# Patient Record
Sex: Female | Born: 1995 | Race: Black or African American | Hispanic: No | Marital: Single | Smoking: Never smoker
Health system: Southern US, Community
[De-identification: ages and names within clinical notes are randomized; demographics above are authoritative.]

## PROBLEM LIST (undated history)

## (undated) DIAGNOSIS — O24419 Gestational diabetes mellitus in pregnancy, unspecified control: Secondary | ICD-10-CM

## (undated) DIAGNOSIS — Z789 Other specified health status: Secondary | ICD-10-CM

## (undated) HISTORY — DX: Gestational diabetes mellitus in pregnancy, unspecified control: O24.419

## (undated) HISTORY — PX: EYE SURGERY: SHX253

---

## 2015-05-26 LAB — OB RESULTS CONSOLE ANTIBODY SCREEN: ANTIBODY SCREEN: NEGATIVE

## 2015-05-26 LAB — OB RESULTS CONSOLE RUBELLA ANTIBODY, IGM: RUBELLA: IMMUNE

## 2015-05-26 LAB — OB RESULTS CONSOLE RPR: RPR: NONREACTIVE

## 2015-05-26 LAB — OB RESULTS CONSOLE HEPATITIS B SURFACE ANTIGEN: HEP B S AG: NEGATIVE

## 2015-05-26 LAB — OB RESULTS CONSOLE ABO/RH: RH TYPE: POSITIVE

## 2015-05-26 LAB — OB RESULTS CONSOLE HIV ANTIBODY (ROUTINE TESTING): HIV: NONREACTIVE

## 2015-05-31 NOTE — L&D Delivery Note (Signed)
Delivery Note  First Stage: Labor onset: 1932 Augmentation : pitocin Analgesia /Anesthesia intrapartum: nitrous oxide AROM at 1932  Second Stage: Complete dilation at 2229 Onset of pushing at 2229 FHR second stage 115 with pushing, but back to 135 in between  Delivery of a viable female at 2237 by CNM in ROA position Loose nuchal cord x 1 Cord double clamped after cessation of pulsation, cut by FOB Cord blood sample collected    Third Stage: Placenta delivered via Tomasa BlaseSchultz intact with 3 VC @ 2248 Placenta disposition: L&D Uterine tone firm / bleeding minimal  2nd degree vaginal laceration identified  Anesthesia for repair: nitrous oxide and 1% Lidocaine Repair 3-0 vicryl Est. Blood Loss (mL): 150  Complications: none  Mom to postpartum.  Baby to Couplet care / Skin to Skin.  Newborn: Birth Weight: 8 lbs 5 oz  Apgar Scores: 8/9 Feeding planned: breast  Raelyn MoraAWSON, Rhonda Cisneros, M  MSN, CNM 12/09/2015, 11:16 PM

## 2015-07-17 LAB — OB RESULTS CONSOLE GC/CHLAMYDIA
Chlamydia: NEGATIVE
Gonorrhea: NEGATIVE

## 2015-09-20 ENCOUNTER — Inpatient Hospital Stay (HOSPITAL_COMMUNITY)
Admission: AD | Admit: 2015-09-20 | Discharge: 2015-09-20 | Disposition: A | Discharge status: Home / Self Care | Payer: BLUE CROSS/BLUE SHIELD | Source: Ambulatory Visit | Attending: Obstetrics & Gynecology | Admitting: Obstetrics & Gynecology

## 2015-09-20 ENCOUNTER — Encounter (HOSPITAL_COMMUNITY): Payer: Self-pay | Admitting: *Deleted

## 2015-09-20 DIAGNOSIS — N898 Other specified noninflammatory disorders of vagina: Secondary | ICD-10-CM | POA: Diagnosis not present

## 2015-09-20 DIAGNOSIS — O26892 Other specified pregnancy related conditions, second trimester: Secondary | ICD-10-CM | POA: Insufficient documentation

## 2015-09-20 DIAGNOSIS — O26893 Other specified pregnancy related conditions, third trimester: Secondary | ICD-10-CM

## 2015-09-20 DIAGNOSIS — Z3A27 27 weeks gestation of pregnancy: Secondary | ICD-10-CM | POA: Insufficient documentation

## 2015-09-20 DIAGNOSIS — R109 Unspecified abdominal pain: Secondary | ICD-10-CM | POA: Diagnosis present

## 2015-09-20 HISTORY — DX: Other specified health status: Z78.9

## 2015-09-20 LAB — URINALYSIS, ROUTINE W REFLEX MICROSCOPIC
BILIRUBIN URINE: NEGATIVE
GLUCOSE, UA: NEGATIVE mg/dL
HGB URINE DIPSTICK: NEGATIVE
Ketones, ur: NEGATIVE mg/dL
Leukocytes, UA: NEGATIVE
Nitrite: NEGATIVE
Protein, ur: NEGATIVE mg/dL
SPECIFIC GRAVITY, URINE: 1.015 (ref 1.005–1.030)
pH: 6.5 (ref 5.0–8.0)

## 2015-09-20 NOTE — MAU Provider Note (Signed)
History     CSN: [id number]  Arrival date and time: 09/20/15 1303   First Provider Initiated Contact with Patient 09/20/15 1342      Chief Complaint  Patient presents with  . Abdominal Pain   HPI Ms. Rhonda Cisneros is a 20 y.o. G1P0 at 9658w6d who presents to MAU today with complaint of mucous discharge today. The patient denies vaginal bleeding, LOF, contractions, UTI symptoms, fever or N/V/D today. She states occasional mild cramping that has improved today. She has not taken anything for pain. She denies pain now. She does endorse mild, intermittent constipation. She denies complications with the pregnancy and reports good fetal movement today.    OB History    Gravida Para Term Preterm AB TAB SAB Ectopic Multiple Living   1               Past Medical History  Diagnosis Date  . Medical history non-contributory     Past Surgical History  Procedure Laterality Date  . Eye surgery      Family History  Problem Relation Age of Onset  . Alcohol abuse Neg Hx   . Arthritis Neg Hx   . Asthma Neg Hx   . Birth defects Neg Hx   . Cancer Neg Hx   . COPD Neg Hx   . Depression Neg Hx   . Diabetes Neg Hx   . Drug abuse Neg Hx   . Early death Neg Hx   . Hearing loss Neg Hx   . Heart disease Neg Hx   . Hyperlipidemia Neg Hx   . Hypertension Neg Hx   . Kidney disease Neg Hx   . Learning disabilities Neg Hx   . Mental illness Neg Hx   . Mental retardation Neg Hx   . Miscarriages / Stillbirths Neg Hx   . Stroke Neg Hx   . Vision loss Neg Hx   . Varicose Veins Neg Hx     Social History  Substance Use Topics  . Smoking status: Never Smoker   . Smokeless tobacco: None  . Alcohol Use: No    Allergies: No Known Allergies  Prescriptions prior to admission  Medication Sig Dispense Refill Last Dose  . Prenatal Vit-Fe Fumarate-FA (PRENATAL MULTIVITAMIN) TABS tablet Take 1 tablet by mouth daily at 12 noon.   09/19/2015 at Unknown time    Review of Systems  Constitutional:  Negative for fever and malaise/fatigue.  Gastrointestinal: Positive for constipation. Negative for nausea, vomiting, abdominal pain and diarrhea.  Genitourinary: Negative for dysuria, urgency and frequency.       + mucus discharge Neg - vaginal bleeding, LOF   Physical Exam   Blood pressure 129/66, pulse 104, temperature 98.9 F (37.2 C), temperature source Oral, resp. rate 18, last menstrual period 03/09/2015.  Physical Exam  Nursing note and vitals reviewed. Constitutional: She is oriented to person, place, and time. She appears well-developed and well-nourished. No distress.  HENT:  Head: Normocephalic and atraumatic.  Cardiovascular: Normal rate.   Respiratory: Effort normal.  GI: Soft. She exhibits no distension and no mass. There is no tenderness. There is no rebound and no guarding.  Genitourinary: Uterus is enlarged (appropriate for GA). Cervix exhibits no motion tenderness, no discharge and no friability. No bleeding in the vagina. Vaginal discharge (small amount of clear-white mucous discharge) found.  Neurological: She is alert and oriented to person, place, and time.  Skin: Skin is warm and dry. No erythema.  Psychiatric: She has a normal mood  and affect.  Dilation: Closed Effacement (%): Thick Station: Ballotable Exam by:: Harlon Flor PA   Results for orders placed or performed during the hospital encounter of 09/20/15 (from the past 24 hour(s))  Urinalysis, Routine w reflex microscopic (not at [facility])     Status: None   Collection Time: 09/20/15  1:21 PM  Result Value Ref Range   Color, Urine YELLOW YELLOW   APPearance CLEAR CLEAR   Specific Gravity, Urine 1.015 1.005 - 1.030   pH 6.5 5.0 - 8.0   Glucose, UA NEGATIVE NEGATIVE mg/dL   Hgb urine dipstick NEGATIVE NEGATIVE   Bilirubin Urine NEGATIVE NEGATIVE   Ketones, ur NEGATIVE NEGATIVE mg/dL   Protein, ur NEGATIVE NEGATIVE mg/dL   Nitrite NEGATIVE NEGATIVE   Leukocytes, UA NEGATIVE NEGATIVE   Fetal  Monitoring: Baseline: 140 bpm Variability: moderate Accelerations: 15 x15 Decelerations: none Contractions: none  MAU Course  Procedures None  MDM UA today without evidence of dehydration or infection Discussed patient with Dr. Seymour Bars. Agrees with plan for discharge today with plan to follow-up in the office as scheduled.   Assessment and Plan  A: SIUP at [redacted]w[redacted]d Vaginal discharge in pregnancy  P: Discharge home Preterm labor precautions discussed Patient advised to follow-up with [facility] as scheduled for routine prenatal care Patient may return to MAU as needed or if her condition were to change or worsen   Marny Lowenstein, PA-C  09/20/2015, 1:53 PM

## 2015-09-20 NOTE — MAU Note (Signed)
States that she lost her mucous plug this AM around 0900; c/o intermittent mild cramping since yesterday @ 1300; no bleeding or leaking; usually works around 6 hours a day and is on her feet a lot;

## 2015-11-02 ENCOUNTER — Other Ambulatory Visit (HOSPITAL_COMMUNITY): Payer: Self-pay

## 2015-11-05 ENCOUNTER — Other Ambulatory Visit (HOSPITAL_COMMUNITY): Payer: Self-pay | Admitting: Obstetrics and Gynecology

## 2015-11-05 ENCOUNTER — Ambulatory Visit (HOSPITAL_COMMUNITY)
Admission: RE | Admit: 2015-11-05 | Discharge: 2015-11-05 | Disposition: A | Discharge status: Home / Self Care | Payer: BLUE CROSS/BLUE SHIELD | Source: Ambulatory Visit | Attending: Obstetrics and Gynecology | Admitting: Obstetrics and Gynecology

## 2015-11-05 ENCOUNTER — Encounter: Payer: BLUE CROSS/BLUE SHIELD | Attending: Obstetrics & Gynecology | Admitting: *Deleted

## 2015-11-05 DIAGNOSIS — Z3689 Encounter for other specified antenatal screening: Secondary | ICD-10-CM

## 2015-11-05 DIAGNOSIS — O24410 Gestational diabetes mellitus in pregnancy, diet controlled: Secondary | ICD-10-CM

## 2015-11-05 DIAGNOSIS — Z3A27 27 weeks gestation of pregnancy: Secondary | ICD-10-CM

## 2015-11-05 DIAGNOSIS — Z029 Encounter for administrative examinations, unspecified: Secondary | ICD-10-CM | POA: Diagnosis not present

## 2015-11-05 DIAGNOSIS — O283 Abnormal ultrasonic finding on antenatal screening of mother: Secondary | ICD-10-CM

## 2015-11-05 NOTE — Progress Notes (Signed)
  Patient was seen on 11/05/15 for Gestational Diabetes self-management visit. The following learning objectives were met by the patient during this course:   States the definition of Gestational Diabetes  States why dietary management is important in controlling blood glucose  Describes the effects each nutrient has on blood glucose levels  Demonstrates ability to create a balanced meal plan  Demonstrates carbohydrate counting   States when to check blood glucose levels  Demonstrates proper blood glucose monitoring techniques  States the effect of stress and exercise on blood glucose levels  States the importance of limiting caffeine and abstaining from alcohol and smoking  Patient already has a blood glucose monitor and states she is testing 4 times a day as directed by MD. She states her FBG are 92-105 and 2 hour post meal BGs are typically under 120 mg/dl  Patient instructed to monitor glucose levels: FBS: 60 - <90 2 hour: <120  *Patient received handouts:  Nutrition Diabetes and Pregnancy  Carbohydrate Counting List  Patient will be seen for follow-up as needed.

## 2015-11-11 ENCOUNTER — Other Ambulatory Visit (HOSPITAL_COMMUNITY): Payer: Self-pay

## 2015-11-13 LAB — OB RESULTS CONSOLE GBS: GBS: POSITIVE

## 2015-11-23 ENCOUNTER — Inpatient Hospital Stay (HOSPITAL_COMMUNITY)
Admission: AD | Admit: 2015-11-23 | Discharge: 2015-11-23 | Disposition: A | Discharge status: Home / Self Care | Payer: BLUE CROSS/BLUE SHIELD | Source: Ambulatory Visit | Attending: Obstetrics & Gynecology | Admitting: Obstetrics & Gynecology

## 2015-11-23 ENCOUNTER — Encounter (HOSPITAL_COMMUNITY): Payer: Self-pay | Admitting: *Deleted

## 2015-11-23 DIAGNOSIS — Z3493 Encounter for supervision of normal pregnancy, unspecified, third trimester: Secondary | ICD-10-CM | POA: Insufficient documentation

## 2015-11-23 NOTE — MAU Note (Signed)
Pt sent from MD office, had non-reactive NST, needs further monitoring.  Also had U/S in office.  Has intermittent contractions, denies LOF or bleeding.

## 2015-11-23 NOTE — Discharge Instructions (Signed)
4 Count Kick    In vertical position, bring one hip and knee to 90 position, straighten leg forward, keeping toes pointed up. Return to 90 then straighten leg downward. Perform ___ times alternating legs.  Copyright  VHI. All rights reserved.  Fetal Movement Counts Patient Name: __________________________________________________ Patient Due Date: ____________________ Performing a fetal movement count is highly recommended in high-risk pregnancies, but it is good for every pregnant woman to do. Your health care provider may ask you to start counting fetal movements at 28 weeks of the pregnancy. Fetal movements often increase:  After eating a full meal.  After physical activity.  After eating or drinking something sweet or cold.  At rest. Pay attention to when you feel the baby is most active. This will help you notice a pattern of your baby's sleep and wake cycles and what factors contribute to an increase in fetal movement. It is important to perform a fetal movement count at the same time each day when your baby is normally most active.  HOW TO COUNT FETAL MOVEMENTS 1. Find a quiet and comfortable area to sit or lie down on your left side. Lying on your left side provides the best blood and oxygen circulation to your baby. 2. Write down the day and time on a sheet of paper or in a journal. 3. Start counting kicks, flutters, swishes, rolls, or jabs in a 2-hour period. You should feel at least 10 movements within 2 hours. 4. If you do not feel 10 movements in 2 hours, wait 2-3 hours and count again. Look for a change in the pattern or not enough counts in 2 hours. SEEK MEDICAL CARE IF:  You feel less than 10 counts in 2 hours, tried twice.  There is no movement in over an hour.  The pattern is changing or taking longer each day to reach 10 counts in 2 hours.  You feel the baby is not moving as he or she usually does. Date: ____________ Movements: ____________ Start time:  ____________ Doreatha MartinFinish time: ____________  Date: ____________ Movements: ____________ Start time: ____________ Doreatha MartinFinish time: ____________ Date: ____________ Movements: ____________ Start time: ____________ Doreatha MartinFinish time: ____________ Date: ____________ Movements: ____________ Start time: ____________ Doreatha MartinFinish time: ____________ Date: ____________ Movements: ____________ Start time: ____________ Doreatha MartinFinish time: ____________ Date: ____________ Movements: ____________ Start time: ____________ Doreatha MartinFinish time: ____________ Date: ____________ Movements: ____________ Start time: ____________ Doreatha MartinFinish time: ____________ Date: ____________ Movements: ____________ Start time: ____________ Doreatha MartinFinish time: ____________  Date: ____________ Movements: ____________ Start time: ____________ Doreatha MartinFinish time: ____________ Date: ____________ Movements: ____________ Start time: ____________ Doreatha MartinFinish time: ____________ Date: ____________ Movements: ____________ Start time: ____________ Doreatha MartinFinish time: ____________ Date: ____________ Movements: ____________ Start time: ____________ Doreatha MartinFinish time: ____________ Date: ____________ Movements: ____________ Start time: ____________ Doreatha MartinFinish time: ____________ Date: ____________ Movements: ____________ Start time: ____________ Doreatha MartinFinish time: ____________ Date: ____________ Movements: ____________ Start time: ____________ Doreatha MartinFinish time: ____________  Date: ____________ Movements: ____________ Start time: ____________ Doreatha MartinFinish time: ____________ Date: ____________ Movements: ____________ Start time: ____________ Doreatha MartinFinish time: ____________ Date: ____________ Movements: ____________ Start time: ____________ Doreatha MartinFinish time: ____________ Date: ____________ Movements: ____________ Start time: ____________ Doreatha MartinFinish time: ____________ Date: ____________ Movements: ____________ Start time: ____________ Doreatha MartinFinish time: ____________ Date: ____________ Movements: ____________ Start time: ____________ Doreatha MartinFinish time: ____________ Date:  ____________ Movements: ____________ Start time: ____________ Doreatha MartinFinish time: ____________  Date: ____________ Movements: ____________ Start time: ____________ Doreatha MartinFinish time: ____________ Date: ____________ Movements: ____________ Start time: ____________ Doreatha MartinFinish time: ____________ Date: ____________ Movements: ____________ Start time: ____________ Doreatha MartinFinish time: ____________ Date: ____________ Movements: ____________ Start  time: ____________ Doreatha MartinFinish time: ____________ Date: ____________ Movements: ____________ Start time: ____________ Doreatha MartinFinish time: ____________ Date: ____________ Movements: ____________ Start time: ____________ Doreatha MartinFinish time: ____________ Date: ____________ Movements: ____________ Start time: ____________ Doreatha MartinFinish time: ____________  Date: ____________ Movements: ____________ Start time: ____________ Doreatha MartinFinish time: ____________ Date: ____________ Movements: ____________ Start time: ____________ Doreatha MartinFinish time: ____________ Date: ____________ Movements: ____________ Start time: ____________ Doreatha MartinFinish time: ____________ Date: ____________ Movements: ____________ Start time: ____________ Doreatha MartinFinish time: ____________ Date: ____________ Movements: ____________ Start time: ____________ Doreatha MartinFinish time: ____________ Date: ____________ Movements: ____________ Start time: ____________ Doreatha MartinFinish time: ____________ Date: ____________ Movements: ____________ Start time: ____________ Doreatha MartinFinish time: ____________  Date: ____________ Movements: ____________ Start time: ____________ Doreatha MartinFinish time: ____________ Date: ____________ Movements: ____________ Start time: ____________ Doreatha MartinFinish time: ____________ Date: ____________ Movements: ____________ Start time: ____________ Doreatha MartinFinish time: ____________ Date: ____________ Movements: ____________ Start time: ____________ Doreatha MartinFinish time: ____________ Date: ____________ Movements: ____________ Start time: ____________ Doreatha MartinFinish time: ____________ Date: ____________ Movements: ____________ Start  time: ____________ Doreatha MartinFinish time: ____________ Date: ____________ Movements: ____________ Start time: ____________ Doreatha MartinFinish time: ____________  Date: ____________ Movements: ____________ Start time: ____________ Doreatha MartinFinish time: ____________ Date: ____________ Movements: ____________ Start time: ____________ Doreatha MartinFinish time: ____________ Date: ____________ Movements: ____________ Start time: ____________ Doreatha MartinFinish time: ____________ Date: ____________ Movements: ____________ Start time: ____________ Doreatha MartinFinish time: ____________ Date: ____________ Movements: ____________ Start time: ____________ Doreatha MartinFinish time: ____________ Date: ____________ Movements: ____________ Start time: ____________ Doreatha MartinFinish time: ____________ Date: ____________ Movements: ____________ Start time: ____________ Doreatha MartinFinish time: ____________  Date: ____________ Movements: ____________ Start time: ____________ Doreatha MartinFinish time: ____________ Date: ____________ Movements: ____________ Start time: ____________ Doreatha MartinFinish time: ____________ Date: ____________ Movements: ____________ Start time: ____________ Doreatha MartinFinish time: ____________ Date: ____________ Movements: ____________ Start time: ____________ Doreatha MartinFinish time: ____________ Date: ____________ Movements: ____________ Start time: ____________ Doreatha MartinFinish time: ____________ Date: ____________ Movements: ____________ Start time: ____________ Doreatha MartinFinish time: ____________   This information is not intended to replace advice given to you by your health care provider. Make sure you discuss any questions you have with your health care provider.   Document Released: 06/15/2006 Document Revised: 06/06/2014 Document Reviewed: 03/12/2012 Elsevier Interactive Patient Education [employer]2016 Elsevier Inc.

## 2015-12-04 ENCOUNTER — Telehealth (HOSPITAL_COMMUNITY): Payer: Self-pay | Admitting: *Deleted

## 2015-12-04 ENCOUNTER — Encounter (HOSPITAL_COMMUNITY): Payer: Self-pay | Admitting: *Deleted

## 2015-12-04 NOTE — Telephone Encounter (Signed)
Preadmission screen  

## 2015-12-07 ENCOUNTER — Encounter (HOSPITAL_COMMUNITY): Payer: Self-pay | Admitting: *Deleted

## 2015-12-07 ENCOUNTER — Other Ambulatory Visit: Payer: Self-pay | Admitting: Obstetrics and Gynecology

## 2015-12-09 ENCOUNTER — Inpatient Hospital Stay (HOSPITAL_COMMUNITY): Payer: BLUE CROSS/BLUE SHIELD

## 2015-12-09 ENCOUNTER — Inpatient Hospital Stay (HOSPITAL_COMMUNITY)
Admission: RE | Admit: 2015-12-09 | Discharge: 2015-12-11 | DRG: 775 | Disposition: A | Discharge status: Home / Self Care | Payer: BLUE CROSS/BLUE SHIELD | Source: Ambulatory Visit | Attending: Obstetrics and Gynecology | Admitting: Obstetrics and Gynecology

## 2015-12-09 ENCOUNTER — Encounter (HOSPITAL_COMMUNITY): Payer: Self-pay

## 2015-12-09 VITALS — BP 119/68 | HR 68 | Temp 98.6°F | Resp 17 | Ht 66.0 in | Wt 268.0 lb

## 2015-12-09 DIAGNOSIS — Z3A39 39 weeks gestation of pregnancy: Secondary | ICD-10-CM | POA: Diagnosis not present

## 2015-12-09 DIAGNOSIS — O99824 Streptococcus B carrier state complicating childbirth: Secondary | ICD-10-CM | POA: Diagnosis present

## 2015-12-09 DIAGNOSIS — O99214 Obesity complicating childbirth: Secondary | ICD-10-CM | POA: Diagnosis present

## 2015-12-09 DIAGNOSIS — O24419 Gestational diabetes mellitus in pregnancy, unspecified control: Secondary | ICD-10-CM

## 2015-12-09 DIAGNOSIS — O24425 Gestational diabetes mellitus in childbirth, controlled by oral hypoglycemic drugs: Principal | ICD-10-CM | POA: Diagnosis present

## 2015-12-09 DIAGNOSIS — Z6841 Body Mass Index (BMI) 40.0 and over, adult: Secondary | ICD-10-CM

## 2015-12-09 DIAGNOSIS — Z3490 Encounter for supervision of normal pregnancy, unspecified, unspecified trimester: Secondary | ICD-10-CM

## 2015-12-09 HISTORY — DX: Gestational diabetes mellitus in pregnancy, unspecified control: O24.419

## 2015-12-09 LAB — CBC
HEMATOCRIT: 35.4 % — AB (ref 36.0–46.0)
Hemoglobin: 11.9 g/dL — ABNORMAL LOW (ref 12.0–15.0)
MCH: 28.5 pg (ref 26.0–34.0)
MCHC: 33.6 g/dL (ref 30.0–36.0)
MCV: 84.7 fL (ref 78.0–100.0)
PLATELETS: 229 10*3/uL (ref 150–400)
RBC: 4.18 MIL/uL (ref 3.87–5.11)
RDW: 14.2 % (ref 11.5–15.5)
WBC: 10.7 10*3/uL — AB (ref 4.0–10.5)

## 2015-12-09 LAB — GLUCOSE, CAPILLARY
GLUCOSE-CAPILLARY: 101 mg/dL — AB (ref 65–99)
GLUCOSE-CAPILLARY: 86 mg/dL (ref 65–99)
GLUCOSE-CAPILLARY: 70 mg/dL (ref 65–99)
GLUCOSE-CAPILLARY: 94 mg/dL (ref 65–99)

## 2015-12-09 LAB — TYPE AND SCREEN
ABO/RH(D): A POS
ANTIBODY SCREEN: NEGATIVE

## 2015-12-09 LAB — RPR: RPR: NONREACTIVE

## 2015-12-09 LAB — ABO/RH: ABO/RH(D): A POS

## 2015-12-09 MED ORDER — MISOPROSTOL 200 MCG PO TABS
50.0000 ug | ORAL_TABLET | ORAL | Status: DC
  Administered 2015-12-09: 50 ug via ORAL
  Filled 2015-12-09 (×2): qty 0.5

## 2015-12-09 MED ORDER — OXYCODONE-ACETAMINOPHEN 5-325 MG PO TABS: 1.0000 | ORAL_TABLET | ORAL | Status: DC | PRN

## 2015-12-09 MED ORDER — ACETAMINOPHEN 325 MG PO TABS: 650.0000 mg | ORAL_TABLET | ORAL | Status: DC | PRN

## 2015-12-09 MED ORDER — OXYTOCIN BOLUS FROM INFUSION
500.0000 mL | INTRAVENOUS | Status: DC
  Administered 2015-12-09: 500 mL via INTRAVENOUS

## 2015-12-09 MED ORDER — TERBUTALINE SULFATE 1 MG/ML IJ SOLN
0.2500 mg | Freq: Once | INTRAMUSCULAR | Status: DC | PRN
  Filled 2015-12-09: qty 1

## 2015-12-09 MED ORDER — LACTATED RINGERS IV SOLN: 500.0000 mL | INTRAVENOUS | Status: DC | PRN

## 2015-12-09 MED ORDER — ONDANSETRON HCL 4 MG/2ML IJ SOLN
4.0000 mg | Freq: Four times a day (QID) | INTRAMUSCULAR | Status: DC | PRN
  Administered 2015-12-09: 4 mg via INTRAVENOUS
  Filled 2015-12-09: qty 2

## 2015-12-09 MED ORDER — LIDOCAINE HCL (PF) 1 % IJ SOLN
30.0000 mL | INTRAMUSCULAR | Status: DC | PRN
  Administered 2015-12-09: 30 mL via SUBCUTANEOUS
  Filled 2015-12-09: qty 30

## 2015-12-09 MED ORDER — SOD CITRATE-CITRIC ACID 500-334 MG/5ML PO SOLN: 30.0000 mL | ORAL | Status: DC | PRN

## 2015-12-09 MED ORDER — PENICILLIN G POTASSIUM 5000000 UNITS IJ SOLR
5.0000 10*6.[IU] | Freq: Once | INTRAVENOUS | Status: AC
  Administered 2015-12-09: 5 10*6.[IU] via INTRAVENOUS
  Filled 2015-12-09: qty 5

## 2015-12-09 MED ORDER — OXYCODONE-ACETAMINOPHEN 5-325 MG PO TABS: 2.0000 | ORAL_TABLET | ORAL | Status: DC | PRN

## 2015-12-09 MED ORDER — OXYTOCIN 40 UNITS IN LACTATED RINGERS INFUSION - SIMPLE MED: 2.5000 [IU]/h | INTRAVENOUS | Status: DC

## 2015-12-09 MED ORDER — LACTATED RINGERS IV SOLN
INTRAVENOUS | Status: DC
  Administered 2015-12-09 (×2): via INTRAVENOUS

## 2015-12-09 MED ORDER — METFORMIN HCL ER 500 MG PO TB24
1000.0000 mg | ORAL_TABLET | Freq: Every day | ORAL | Status: DC
  Filled 2015-12-09: qty 2

## 2015-12-09 MED ORDER — OXYTOCIN 40 UNITS IN LACTATED RINGERS INFUSION - SIMPLE MED
1.0000 m[IU]/min | INTRAVENOUS | Status: DC
  Administered 2015-12-09: 2 m[IU]/min via INTRAVENOUS
  Filled 2015-12-09: qty 1000

## 2015-12-09 MED ORDER — NALBUPHINE HCL 10 MG/ML IJ SOLN: 5.0000 mg | INTRAMUSCULAR | Status: DC | PRN

## 2015-12-09 MED ORDER — PENICILLIN G POTASSIUM 5000000 UNITS IJ SOLR
2.5000 10*6.[IU] | INTRAVENOUS | Status: DC
  Administered 2015-12-09 (×2): 2.5 10*6.[IU] via INTRAVENOUS
  Filled 2015-12-09 (×7): qty 2.5

## 2015-12-09 NOTE — Progress Notes (Signed)
Inpatient Diabetes Program Recommendations  AACE/ADA: New Consensus Statement on Inpatient Glycemic Control (2015)  Target Ranges:  Prepandial:   less than 140 mg/dL      Peak postprandial:   less than 180 mg/dL (1-2 hours)      Critically ill patients:  140 - 180 mg/dL   No results found for: GLUCAP, HGBA1C  Review of Glycemic Control  Diabetes Treatment Program Recommendations  ADA Standards of Care 2017 Diabetes in Pregnancy Target Glucose Ranges:  Fasting: 60 - 90 mg/dL Preprandial: 60 - 811105 mg/dL 1 hr postprandial: Less than 140mg /dL (from first bite of meal) 2 hr postprandial: Less than 120 mg/dL (from first bit of meal)  Inpatient Diabetes Program Recommendations:    Although patient is gestational dm only on metformin, please consider checking a cbg at this time.  Thank you [name], RN, MSN, CDE  Diabetes Inpatient Program Office: [phone] Pager: [phone] 8:00 am to 5:00 pm

## 2015-12-09 NOTE — Progress Notes (Addendum)
Patient ID: Rhonda Cisneros, female   DOB: 05-16-1996, 20 y.o.   MRN: [id number] Subjective: Rhonda Cisneros is a 20 y.o. G1P0 at 1318w2d by ultrasound admitted for induction of labor due to Gestational diabetes.  Objective: Filed Vitals:   12/09/15 1342 12/09/15 1500 12/09/15 1501 12/09/15 1529  BP: 102/83  128/75 120/72  Pulse: 86  76 88  Temp:      TempSrc:      Resp:  16  16  Height:      Weight:          FHT:  FHR: 145 bpm, variability: moderate,  accelerations:  Present,  decelerations:  Absent UC:   irregular SVE:   Dilation: 5 Effacement (%): 40 Station: -3 Exam by:: k fields, rn Foley balloon out @ 1340  Labs:   Recent Labs  12/09/15 0845  WBC 10.7*  HGB 11.9*  HCT 35.4*  PLT 229  CBG values: 101 & 86  Assessment / Plan: Induction of labor due to gestational diabetes,  progressing well with foley balloon and cytotec GDMA2   Labor: Progressing normally Preeclampsia:  N/A Fetal Wellbeing:  Category I Pain Control:  Labor support without medications  Continue CBG checks every 4 hours Start Pitocin at 2 mU and increase by 2mU Plan AROM in active labor and better cervical progression Anticipated MOD:  Guarded for NSVD  *Dr. Billy Coastaavon notified of pt's progress and POC - agrees  Rhonda Cisneros, Rhonda Cisneros, M, MSN, CNM 12/09/2015, 1:55 PM

## 2015-12-09 NOTE — Progress Notes (Signed)
Patient ID: Rhonda Cisneros, female   DOB: Apr 08, 1996, 20 y.o.   MRN: [id number] S: Doing well, reports not feeling any pain; rating pain 2/10.   O: Filed Vitals:   12/09/15 1736 12/09/15 1801 12/09/15 1803 12/09/15 1833  BP: 126/67  92/48 111/51  Pulse: 83  80 68  Temp:      TempSrc:      Resp:  16  16  Height:      Weight:         FHT:  FHR: 140 bpm, variability: moderate,  accelerations:  Present,  decelerations:  Present variables with occ. late decels UC:   irregular, every 1-8 minutes SVE:  Deferred Pitocin decreased to 8 mU with late decels / pitocin now increased to 10 mU with resolution of late decels   A / P: Induction of labor due to gestational diabetes,  progressing well on pitocin  Fetal Wellbeing:  Category I Pain Control:  Labor support without medications  Anticipated MOD:  guarded for NSVD  Rhonda Cisneros, Tayt Moyers, Judie PetitM MSN, CNM 12/09/2015, 6:34 PM

## 2015-12-09 NOTE — H&P (Signed)
OB ADMISSION/ HISTORY & PHYSICAL:  Admission Date: 12/09/2015  7:28 AM  Admit Diagnosis: Induction of Labor / GDM Class A2  Rhonda Cisneros is a 20 y.o. female presenting for IOL for GDM A2.  Prenatal History: G1P0   EDC : 12/14/2015, by Last Menstrual Period  Prenatal care at [facility] since 11.[redacted] weeks gestation [facility]: Bauer Ausborn,CNM  Prenatal course complicated by Obesity / GDM A2 / Excessive maternal weight gain /   Prenatal Labs: ABO, Rh: A (12/27 0000)  Antibody: Negative (12/27 0000) Rubella: Immune (12/27 0000)  RPR: Nonreactive (12/27 0000)  HBsAg: Negative (12/27 0000)  HIV: Non-reactive (12/27 0000)  GBS: Positive (06/16 0000)  GTT : Abnormal 1 hr GTT and 3 hr GTT   Medical / Surgical History :  Past medical history:  Past Medical History  Diagnosis Date  . Medical history non-contributory   . Gestational diabetes     metformin  . Gestational diabetes mellitus, class A2 12/09/2015  . Indication for care in labor or delivery 12/09/2015     Past surgical history:  Past Surgical History  Procedure Laterality Date  . Eye surgery       Family History:  Family History  Problem Relation Age of Onset  . Alcohol abuse Neg Hx   . Arthritis Neg Hx   . Asthma Neg Hx   . Birth defects Neg Hx   . COPD Neg Hx   . Depression Neg Hx   . Diabetes Neg Hx   . Drug abuse Neg Hx   . Early death Neg Hx   . Hearing loss Neg Hx   . Heart disease Neg Hx   . Hyperlipidemia Neg Hx   . Kidney disease Neg Hx   . Learning disabilities Neg Hx   . Mental illness Neg Hx   . Mental retardation Neg Hx   . Miscarriages / Stillbirths Neg Hx   . Stroke Neg Hx   . Vision loss Neg Hx   . Varicose Veins Neg Hx   . Anemia Mother   . Sickle cell trait Mother   . Hypertension Paternal Grandmother   . Cancer Paternal Grandmother      Social History:  reports that she has never smoked. She has never used smokeless tobacco. She  reports that she does not drink alcohol or use illicit drugs.   Allergies: Review of patient's allergies indicates no known allergies.    Current Medications at time of admission:  Prescriptions prior to admission  Medication Sig Dispense Refill Last Dose  . metFORMIN (GLUMETZA) 1000 MG (MOD) 24 hr tablet Take 1,000 mg by mouth at bedtime.    12/08/2015 at Unknown time  . Prenatal Vit-Fe Fumarate-FA (PRENATAL MULTIVITAMIN) TABS tablet Take 1 tablet by mouth daily at 12 noon.   12/08/2015 at Unknown time      Review of Systems: Active FM No UC's  Physical Exam:  Today's Vitals   12/09/15 0813 12/09/15 0817 12/09/15 0818  BP: 135/74    Pulse: 107    Temp: 98.2 F (36.8 C)    TempSrc: Oral    Resp: 16    Height:  5\' 6"  (1.676 m)   Weight:  121.564 kg (268 lb)   PainSc:   0-No pain   General: alert and oriented x 3, NAD Heart: RRR, no murmurs Lungs: CTAB Abdomen: Soft and non-tender, non-distended / Uterus: Gravid, non-tender Extremities: trace edema Genitalia / VE: Dilation: 1.5 Effacement (%):  40 Station: -3 Exam by:: Shyler Holzman,cnm  Foley balloon placed FHR: 140 bpm / moderate variability / accels present / no decels TOCO: no UC's  Labs:     Recent Labs  12/09/15 0845  WBC 10.7*  HGB 11.9*  HCT 35.4*  PLT 229     Assessment:  20 y.o. G1P0 at [redacted]w[redacted]d  1. Labor: IOL 2. Fetal Wellbeing: Category 1  3. Pain Control: none 4. GBS: Positive   Plan:  1. Admit to BS 2. Routine L&D orders 3. Nitrous Oxide PRN  4. Nubain 5 mg IVP every 2 hours PRN 5. Check CBG every 4 hours  *Explained procedure for cervical balloon placement. No questions or concerns at this time. Patient agrees to cervical balloon placement. / Cervical balloon placed without difficulty, inflated balloon with 60 ml of LR / patient tolerated procedure well, mild discomfort during procedure / no bleeding with procedure / catheter secured to LT inner thigh with traction. Maintain and increase  traction every 2 hrs until balloon falls out or is removed.    Dr. Ernestina Penna notified of admission / plan of care    Kenard Gower, MSN, CNM 12/09/2015, 9:23 AM

## 2015-12-09 NOTE — Anesthesia Pain Management Evaluation Note (Signed)
  CRNA Pain Management Visit Note  Patient: Charlane Ferrettittiesha Akel, 20 y.o., female  "Hello [facility] am [facility]. We have an anesthesia team available at all times to provide care throughout the hospital, including epidural management and anesthesia for C-section. I don't know your plan for the delivery whether it a natural birth, water birth, IV sedation, nitrous supplementation, doula or epidural, but we want to meet your pain goals."   1.Was your pain managed to your expectations on prior hospitalizations?   No prior hospitalizations  2.What is your expectation for pain management during this hospitalization?     Labor support without medications  3.How can we help you reach that goal? Be there if needed  Record the patient's initial score and the patient's pain goal.   Pain: 0  Pain Goal: 7 [facility] wants you to be able to say your pain was always managed very well.  Jood Retana 12/09/2015

## 2015-12-09 NOTE — Progress Notes (Addendum)
Patient ID: Isys Picotte, female   DOB: 6/Charlane Ferretti29/1997, 20 y.o.   MRN: [id number] S: Doing well, not feeling pain at this time. LT lateral position with peanut ball.   O: Filed Vitals:   12/09/15 1833 12/09/15 1905 12/09/15 1906 12/09/15 1942  BP: 111/51  125/69 119/67  Pulse: 68  86 80  Temp:      TempSrc:      Resp: 16 16  16   Height:      Weight:         FHT:  FHR: 140 bpm, variability: moderate,  accelerations:  Present,  decelerations:  Present prolonged variables UC:   irregular, every 1-8 minutes SVE:   Dilation: 5 Effacement (%): 80 Station: -3; well-applied, but no change in descent with AROM and fundal pressure Exam by:: Tressie Ragin, cnm AROM lots of light meconium fluid / IUPC placed  A / P: Induction of labor due to gestational diabetes,  progressing well on pitocin  Fetal Wellbeing:  Category II Pain Control:  Labor support without medications  Anticipated MOD:  guarded for NSVD  Raelyn MoraAWSON, Latysha Thackston, M MSN, CNM 12/09/2015, 7:32 PM

## 2015-12-10 ENCOUNTER — Encounter (HOSPITAL_COMMUNITY): Payer: Self-pay

## 2015-12-10 LAB — CBC
HEMATOCRIT: 31 % — AB (ref 36.0–46.0)
Hemoglobin: 10.4 g/dL — ABNORMAL LOW (ref 12.0–15.0)
MCH: 28.2 pg (ref 26.0–34.0)
MCHC: 33.5 g/dL (ref 30.0–36.0)
MCV: 84 fL (ref 78.0–100.0)
Platelets: 231 10*3/uL (ref 150–400)
RBC: 3.69 MIL/uL — ABNORMAL LOW (ref 3.87–5.11)
RDW: 14.3 % (ref 11.5–15.5)
WBC: 17.4 10*3/uL — ABNORMAL HIGH (ref 4.0–10.5)

## 2015-12-10 MED ORDER — COCONUT OIL OIL: 1.0000 "application " | TOPICAL_OIL | Status: DC | PRN

## 2015-12-10 MED ORDER — BENZOCAINE-MENTHOL 20-0.5 % EX AERO
1.0000 "application " | INHALATION_SPRAY | CUTANEOUS | Status: DC | PRN
  Filled 2015-12-10: qty 56

## 2015-12-10 MED ORDER — WITCH HAZEL-GLYCERIN EX PADS: 1.0000 "application " | MEDICATED_PAD | CUTANEOUS | Status: DC | PRN

## 2015-12-10 MED ORDER — DIBUCAINE 1 % RE OINT: 1.0000 "application " | TOPICAL_OINTMENT | RECTAL | Status: DC | PRN

## 2015-12-10 MED ORDER — ONDANSETRON HCL 4 MG PO TABS: 4.0000 mg | ORAL_TABLET | ORAL | Status: DC | PRN

## 2015-12-10 MED ORDER — IBUPROFEN 600 MG PO TABS
600.0000 mg | ORAL_TABLET | Freq: Four times a day (QID) | ORAL | Status: DC
  Administered 2015-12-10 – 2015-12-11 (×6): 600 mg via ORAL
  Filled 2015-12-10 (×5): qty 1

## 2015-12-10 MED ORDER — ACETAMINOPHEN 325 MG PO TABS
650.0000 mg | ORAL_TABLET | ORAL | Status: DC | PRN
  Administered 2015-12-10: 650 mg via ORAL
  Filled 2015-12-10: qty 2

## 2015-12-10 MED ORDER — ZOLPIDEM TARTRATE 5 MG PO TABS: 5.0000 mg | ORAL_TABLET | Freq: Every evening | ORAL | Status: DC | PRN

## 2015-12-10 MED ORDER — DIPHENHYDRAMINE HCL 25 MG PO CAPS: 25.0000 mg | ORAL_CAPSULE | Freq: Four times a day (QID) | ORAL | Status: DC | PRN

## 2015-12-10 MED ORDER — OXYCODONE HCL 5 MG PO TABS
5.0000 mg | ORAL_TABLET | ORAL | Status: DC | PRN
  Administered 2015-12-10: 5 mg via ORAL
  Filled 2015-12-10: qty 1

## 2015-12-10 MED ORDER — PRENATAL MULTIVITAMIN CH
1.0000 | ORAL_TABLET | Freq: Every day | ORAL | Status: DC
  Administered 2015-12-10 – 2015-12-11 (×2): 1 via ORAL
  Filled 2015-12-10: qty 1

## 2015-12-10 MED ORDER — OXYCODONE HCL 5 MG PO TABS: 10.0000 mg | ORAL_TABLET | ORAL | Status: DC | PRN

## 2015-12-10 MED ORDER — TETANUS-DIPHTH-ACELL PERTUSSIS 5-2.5-18.5 LF-MCG/0.5 IM SUSP: 0.5000 mL | Freq: Once | INTRAMUSCULAR | Status: DC

## 2015-12-10 MED ORDER — SIMETHICONE 80 MG PO CHEW
80.0000 mg | CHEWABLE_TABLET | ORAL | Status: DC | PRN
  Administered 2015-12-10: 80 mg via ORAL
  Filled 2015-12-10: qty 1

## 2015-12-10 MED ORDER — ONDANSETRON HCL 4 MG/2ML IJ SOLN: 4.0000 mg | INTRAMUSCULAR | Status: DC | PRN

## 2015-12-10 MED ORDER — SENNOSIDES-DOCUSATE SODIUM 8.6-50 MG PO TABS
2.0000 | ORAL_TABLET | ORAL | Status: DC
  Administered 2015-12-11: 2 via ORAL
  Filled 2015-12-10: qty 2

## 2015-12-10 NOTE — Progress Notes (Signed)
Patient called out to front desk to give an update. She states she is not feeling any better, the pain medication and gas pill did not help her. Her pain is now moving up into her neck and she has a headache now as well when standing. Since she states that the pain gets better with laying down, i had her lay down flat and she states the pain got better. Will return in 15 minutes to reassess.

## 2015-12-10 NOTE — Lactation Note (Addendum)
This note was copied from a baby's chart. Lactation Consultation Note New mom has large pendulum breast w/flat compressible nipple. Able to compress and obtain deep latch. Mom BF in football position. Hand expressed w/colostrum noted to Rt. Breast. Baby has been BF for 10 min. Noted Rt. Breast significantly softer than Lt. Breast. Mom encouraged to feed baby 8-12 times/24 hours and with feeding cues. Referred to Baby and Me Book in Breastfeeding section Pg. 22-23 for position options and Proper latch demonstration. Educated about newborn behavior, STS, I&O, cluster feeding, supply and demand. WH/LC brochure given w/resources, support groups and LC services. STS d/t low bld. Sugar.  Patient Name: Rhonda Charlane Ferrettittiesha Heal WUJWJ'XToday's Date: 12/10/2015 Reason for consult: Initial assessment   Maternal Data Has patient been taught Hand Expression?: Yes Does the patient have breastfeeding experience prior to this delivery?: No  Feeding Feeding Type: Breast Fed Length of feed: 10 min  LATCH Score/Interventions Latch: Repeated attempts needed to sustain latch, nipple held in mouth throughout feeding, stimulation needed to elicit sucking reflex. Intervention(s): Adjust position;Assist with latch;Breast massage;Breast compression  Audible Swallowing: None Intervention(s): Skin to skin;Hand expression  Type of Nipple: Flat Intervention(s): No intervention needed  Comfort (Breast/Nipple): Soft / non-tender     Hold (Positioning): Assistance needed to correctly position infant at breast and maintain latch. Intervention(s): Breastfeeding basics reviewed;Support Pillows;Skin to skin;Position options  LATCH Score: 5  Lactation Tools Discussed/Used WIC Program: Yes   Consult Status Consult Status: Follow-up Date: 12/10/15 (in pm) Follow-up type: In-patient    Carynn Felling, Diamond NickelLAURA G 12/10/2015, 2:42 AM

## 2015-12-10 NOTE — Progress Notes (Signed)
PPD 1 SVD  S:  Reports feeling ok             Tolerating po/ No nausea or vomiting             Bleeding is moderate             Pain controlled with motrin             Up ad lib / ambulatory / voiding QS  Newborn breast and formula feeding O:               VS: BP 112/63 mmHg  Pulse 77  Temp(Src) 98 F (36.7 C) (Oral)  Resp 20  Ht 5\' 6"  (1.676 m)  Wt 121.564 kg (268 lb)  BMI 43.28 kg/m2  SpO2 99%  LMP 03/09/2015  Breastfeeding? Unknown   LABS:              Recent Labs  12/09/15 0845 12/10/15 0515  WBC 10.7* 17.4*  HGB 11.9* 10.4*  PLT 229 231               Blood type: --/--/A POS, A POS (07/12 0845)  Rubella: Immune (12/27 0000)                     I&O: Intake/Output      07/12 0701 - 07/13 0700 07/13 0701 - 07/14 0700   Urine (mL/kg/hr) 0    Blood 150    Total Output 150     Net -150          Urine Occurrence 2 x                  Physical Exam:             Alert and oriented X3  Abdomen: soft, non-tender, non-distended              Fundus: firm, non-tender, U-1  Perineum: mild edema / ice pack in place  Lochia: light  Extremities: 1+ pedal edema, no calf pain or tenderness    A: PPD # 1              GDMa2 - delivered   Doing well - stable status  P: Routine post partum orders   Marlinda MikeBAILEY, Domenik Trice CNM, MSN, [facility] 12/10/2015, 12:13 PM

## 2015-12-10 NOTE — Progress Notes (Addendum)
Patient calls out to the front desk stating she is having chest pain. Patient appears to be comfortable; she states her entire chest region hurts and it increases in pain when standing. She describes the pain as tight and dull only, when lying down or sitting it is at a 3/10 and when standing goes up to a 6-8. When standing for a long period of time she becomes short of breath and has to sit down. VS: BP 117/66  HR 84  Resp 18 SPO2 99 Temp 98.4. Assessment is WNL, lungs are clear and heart sounds normal. Nurse Midwife Marlinda Mikeanya Bailey notified. She requests percocet to be given throughout the night for pain, if pain continues or gets worse call back. Will also medicate with a gas pill as ordered.

## 2015-12-10 NOTE — Lactation Note (Signed)
This note was copied from a baby's chart. Lactation Consultation Note CN RN wanted LC to verify latch d/t having serum drawn at 0500 for bld. Sugar. Mom had baby on the breast. Needed to adjust position slightly. Mom doing good job BF. Noted nipple everting well when unlatched. Baby showing cues. Mom feeding STS.  Patient Name: Rhonda Charlane Ferrettittiesha Guadalupe ONGEX'BToday's Date: 12/10/2015 Reason for consult: Follow-up assessment;Other (Comment)   Maternal Data Has patient been taught Hand Expression?: Yes Does the patient have breastfeeding experience prior to this delivery?: No  Feeding Feeding Type: Breast Fed Length of feed: 15 min  LATCH Score/Interventions Latch: Repeated attempts needed to sustain latch, nipple held in mouth throughout feeding, stimulation needed to elicit sucking reflex. Intervention(s): Adjust position;Breast massage  Audible Swallowing: A few with stimulation Intervention(s): Skin to skin;Hand expression Intervention(s): Hand expression;Skin to skin;Alternate breast massage  Type of Nipple: Flat  Comfort (Breast/Nipple): Soft / non-tender     Hold (Positioning): Assistance needed to correctly position infant at breast and maintain latch. Intervention(s): Position options;Support Pillows  LATCH Score: 6  Lactation Tools Discussed/Used WIC Program: Yes   Consult Status Consult Status: Follow-up Date: 12/10/15 Follow-up type: In-patient    [name], Daiki Dicostanzo G 12/10/2015, 4:43 AM

## 2015-12-11 LAB — COMPREHENSIVE METABOLIC PANEL
ALT: 18 U/L (ref 14–54)
AST: 24 U/L (ref 15–41)
Albumin: 2.5 g/dL — ABNORMAL LOW (ref 3.5–5.0)
Alkaline Phosphatase: 97 U/L (ref 38–126)
Anion gap: 7 (ref 5–15)
BUN: 12 mg/dL (ref 6–20)
CO2: 25 mmol/L (ref 22–32)
Calcium: 8.5 mg/dL — ABNORMAL LOW (ref 8.9–10.3)
Chloride: 104 mmol/L (ref 101–111)
Creatinine, Ser: 0.66 mg/dL (ref 0.44–1.00)
GFR calc Af Amer: >60 mL/min (ref >60)
GFR calc non Af Amer: >60 mL/min (ref >60)
Glucose, Bld: 93 mg/dL (ref 65–99)
Potassium: 4 mmol/L (ref 3.5–5.1)
Sodium: 136 mmol/L (ref 135–145)
Total Bilirubin: 0.4 mg/dL (ref 0.3–1.2)
Total Protein: 5.9 g/dL — ABNORMAL LOW (ref 6.5–8.1)

## 2015-12-11 MED ORDER — COCONUT OIL OIL: 1.0000 "application " | TOPICAL_OIL | Status: AC | PRN

## 2015-12-11 MED ORDER — MAGNESIUM OXIDE 400 (241.3 MG) MG PO TABS
400.0000 mg | ORAL_TABLET | Freq: Every day | ORAL | Status: DC
  Filled 2015-12-11 (×2): qty 1

## 2015-12-11 MED ORDER — MAGNESIUM OXIDE 400 (241.3 MG) MG PO TABS: 400.0000 mg | ORAL_TABLET | Freq: Every day | ORAL | Status: AC

## 2015-12-11 MED ORDER — POLYETHYLENE GLYCOL 3350 17 G PO PACK
17.0000 g | PACK | Freq: Once | ORAL | Status: DC
  Filled 2015-12-11: qty 1

## 2015-12-11 MED ORDER — IBUPROFEN 600 MG PO TABS: 600.0000 mg | ORAL_TABLET | Freq: Four times a day (QID) | ORAL | Status: DC

## 2015-12-11 MED ORDER — BUTALBITAL-APAP-CAFFEINE 50-325-40 MG PO TABS
2.0000 | ORAL_TABLET | ORAL | Status: DC | PRN
  Administered 2015-12-11: 2 via ORAL
  Filled 2015-12-11: qty 2

## 2015-12-11 MED ORDER — POLYETHYLENE GLYCOL 3350 17 G PO PACK: 17.0000 g | PACK | Freq: Every day | ORAL | Status: AC | PRN

## 2015-12-11 MED ORDER — ACETAMINOPHEN 325 MG PO TABS: 650.0000 mg | ORAL_TABLET | ORAL | Status: DC | PRN

## 2015-12-11 NOTE — Discharge Instructions (Signed)
Breast Pumping Tips °If you are breastfeeding, there may be times when you cannot feed your baby directly. Returning to work or going on a trip are common examples. Pumping allows you to store breast milk and feed it to your baby later.  °You may not get much milk when you first start to pump. Your breasts should start to make more after a few days. If you pump at the times you usually feed your baby, you may be able to keep making enough milk to feed your baby without also using formula. The more often you pump, the more milk you will produce.  °WHEN SHOULD I PUMP?  °· You can begin to pump soon after delivery. However, some experts recommend waiting about 4 weeks before giving your infant a bottle to make sure breastfeeding is going well.  °· If you plan to return to work, begin pumping a few weeks before. This will help you develop techniques that work best for you. It also lets you build up a supply of breast milk.   °· When you are with your infant, feed on demand and pump after each feeding.   °· When you are away from your infant for several hours, pump for about 15 minutes every 2-3 hours. Pump both breasts at the same time if you can.   °· If your infant has a formula feeding, make sure to pump around the same time.     °· If you drink any alcohol, wait 2 hours before pumping.   °HOW DO I PREPARE TO PUMP? °Your let-down reflex is the natural reaction to stimulation that makes your breast milk flow. It is easier to stimulate this reflex when you are relaxed. Find relaxation techniques that work for you. If you have difficulty with your let-down reflex, try these methods:  °· Smell one of your infant's blankets or an item of clothing.   °· Look at a picture or video of your infant.   °· Sit in a quiet, private space.   °· Massage the breast you plan to pump.   °· Place soothing warmth on the breast.   °· Play relaxing music.   °WHAT ARE SOME GENERAL BREAST PUMPING TIPS? °· Wash your hands before you pump. You  do not need to wash your nipples or breasts. °· There are three ways to pump. °· You can use your hand to massage and compress your breast. °· You can use a handheld manual pump. °· You can use an electric pump.   °· Make sure the suction cup (flange) on the breast pump is the right size. Place the flange directly over the nipple. If it is the wrong size or placed the wrong way, it may be painful and cause nipple damage.   °· If pumping is uncomfortable, apply a small amount of purified or modified lanolin to your nipple and areola. °· If you are using an electric pump, adjust the speed and suction power to be more comfortable. °· If pumping is painful or if you are not getting very much milk, you may need a different type of pump. A lactation consultant can help you determine what type of pump to use.   °· Keep a full water bottle near you at all times. Drinking lots of fluid helps you make more milk.  °· You can store your milk to use later. Pumped breast milk can be stored in a sealable, sterile container or plastic bag. Label all stored breast milk with the date you pumped it. °· Milk can stay out at room temperature for up to 8 hours. °·   You can store your milk in the refrigerator for up to 8 days.  You can store your milk in the freezer for 3 months. Thaw frozen milk using warm water. Do not put it in the microwave.  Do not smoke. Smoking can lower your milk supply and harm your infant. If you need help quitting, ask your health care provider to recommend a program.  WHEN SHOULD I CALL MY HEALTH CARE PROVIDER OR A LACTATION CONSULTANT?  You are having trouble pumping.  You are concerned that you are not making enough milk.  You have nipple pain, soreness, or redness.  You want to use birth control. Birth control pills may lower your milk supply. Talk to your health care provider about your options.   This information is not intended to replace advice given to you by your health care provider.  Make sure you discuss any questions you have with your health care provider.   Document Released: 11/03/2009 Document Revised: 05/21/2013 Document Reviewed: 03/08/2013 Elsevier Interactive Patient Education 2016 [employer]  Diabetes Mellitus and Food It is important for you to manage your blood sugar (glucose) level. Your blood glucose level can be greatly affected by what you eat. Eating healthier foods in the appropriate amounts throughout the day at about the same time each day will help you control your blood glucose level. It can also help slow or prevent worsening of your diabetes mellitus. Healthy eating may even help you improve the level of your blood pressure and reach or maintain a healthy weight.  General recommendations for healthful eating and cooking habits include:  Eating meals and snacks regularly. Avoid going long periods of time without eating to lose weight.  Eating a diet that consists mainly of plant-based foods, such as fruits, vegetables, nuts, legumes, and whole grains.  Using low-heat cooking methods, such as baking, instead of high-heat cooking methods, such as deep frying. Work with your dietitian to make sure you understand how to use the Nutrition Facts information on food labels. HOW CAN FOOD AFFECT ME? Carbohydrates Carbohydrates affect your blood glucose level more than any other type of food. Your dietitian will help you determine how many carbohydrates to eat at each meal and teach you how to count carbohydrates. Counting carbohydrates is important to keep your blood glucose at a healthy level, especially if you are using insulin or taking certain medicines for diabetes mellitus. Alcohol Alcohol can cause sudden decreases in blood glucose (hypoglycemia), especially if you use insulin or take certain medicines for diabetes mellitus. Hypoglycemia can be a life-threatening condition. Symptoms of hypoglycemia (sleepiness, dizziness, and disorientation) are  similar to symptoms of having too much alcohol.  If your health care provider has given you approval to drink alcohol, do so in moderation and use the following guidelines:  Women should not have more than one drink per day, and men should not have more than two drinks per day. One drink is equal to:  12 oz of beer.  5 oz of wine.  1 oz of hard liquor.  Do not drink on an empty stomach.  Keep yourself hydrated. Have water, diet soda, or unsweetened iced tea.  Regular soda, juice, and other mixers might contain a lot of carbohydrates and should be counted. WHAT FOODS ARE NOT RECOMMENDED? As you make food choices, it is important to remember that all foods are not the same. Some foods have fewer nutrients per serving than other foods, even though they might have the same number of  calories or carbohydrates. It is difficult to get your body what it needs when you eat foods with fewer nutrients. Examples of foods that you should avoid that are high in calories and carbohydrates but low in nutrients include:  Trans fats (most processed foods list trans fats on the Nutrition Facts label).  Regular soda.  Juice.  Candy.  Sweets, such as cake, pie, doughnuts, and cookies.  Fried foods. WHAT FOODS CAN I EAT? Eat nutrient-rich foods, which will nourish your body and keep you healthy. The food you should eat also will depend on several factors, including:  The calories you need.  The medicines you take.  Your weight.  Your blood glucose level.  Your blood pressure level.  Your cholesterol level. You should eat a variety of foods, including:  Protein.  Lean cuts of meat.  Proteins low in saturated fats, such as fish, egg whites, and beans. Avoid processed meats.  Fruits and vegetables.  Fruits and vegetables that may help control blood glucose levels, such as apples, mangoes, and yams.  Dairy products.  Choose fat-free or low-fat dairy products, such as milk, yogurt,  and cheese.  Grains, bread, pasta, and rice.  Choose whole grain products, such as multigrain bread, whole oats, and brown rice. These foods may help control blood pressure.  Fats.  Foods containing healthful fats, such as nuts, avocado, olive oil, canola oil, and fish. DOES EVERYONE WITH DIABETES MELLITUS HAVE THE SAME MEAL PLAN? Because every person with diabetes mellitus is different, there is not one meal plan that works for everyone. It is very important that you meet with a dietitian who will help you create a meal plan that is just right for you.   This information is not intended to replace advice given to you by your health care provider. Make sure you discuss any questions you have with your health care provider.   Document Released: 02/10/2005 Document Revised: 06/06/2014 Document Reviewed: 04/12/2013 Elsevier Interactive Patient Education [employer]

## 2015-12-11 NOTE — Progress Notes (Signed)
The patient states that after lying on her back for 15-20 minutes that she is no longer having any pain. Midwife Marlinda Mikeanya Bailey was contacted who requested anesthesia to be called to evaluate. After speaking with anesthesia, it was discovered that she had no epidural during labor and the midwife was contacted again. She ordered 2 tablets of fioricet Q4-6 for headaches and will come to assess later on. If the patients pain increases she will come over to assess sooner.

## 2015-12-11 NOTE — Discharge Summary (Signed)
Obstetric Discharge Summary Reason for Admission: induction of labor and Gestational diabetes A2 Prenatal Procedures: NST and ultrasound Intrapartum Procedures: spontaneous vaginal delivery and GBS prophylaxis Postpartum Procedures: none Complications-Operative and Postpartum: 2nd degree perineal laceration HEMOGLOBIN  Date Value Ref Range Status  12/10/2015 10.4* 12.0 - 15.0 g/dL Final   HCT  Date Value Ref Range Status  12/10/2015 31.0* 36.0 - 46.0 % Final    Physical Exam:  General: alert, cooperative and no distress Lochia: appropriate Uterine Fundus: firm Incision: healing well DVT Evaluation: Calf/Ankle edema is present.  Discharge Diagnoses: Term Pregnancy-delivered  Discharge Information: Date: 12/11/2015 Activity: pelvic rest Diet: Diabetic 2200 kcal Medications: PNV, Ibuprofen and magnesium, APAP Condition: stable Instructions: refer to practice specific booklet Discharge to: home Follow-up Information    Follow up with Rhonda Cisneros, Rhonda Cisneros, Rhonda Cisneros, Rhonda Cisneros. Schedule an appointment as soon as possible for a visit in 6 weeks.   Specialty:  Obstetrics and Gynecology   Contact information:   [address] G[address] [place] [id number] [phone]       Newborn Data: Live born female "[name]" Birth Weight: 8 lb 5 oz (3771 g) APGAR: 8, 9  Home with mother.  Rhonda Cisneros, Rhonda Cisneros 12/11/2015, 10:16 AM

## 2015-12-11 NOTE — Progress Notes (Signed)
Here to evaluate patient after TC from nurse 5x this evening with conflicting phone report:  9:15pm: some gas & chest pain - given gas pill                (I requested her to monitor and update if worsens) 11:55pm: chest pain when gets out of bed-normal VS and appears comfortable-notified only has Motrin for pain                           (she requested additional medication - I ordered Percocet PRN) 12:05pm:  pain radiating into neck with headache only with walking / resolves completely when in bed - "sounds like spinal headache"               (I requested to notify anesthesia to evaluate if suspects spinal headache as they are in-house and can determine if anesthesia related - to give Fiorcet 2 tabs and increase PO fluids while awaiting evaluation) 12:10pm: reported that anesthesia wanted MD to evaluate first - they were busy              (I requested to await their evaluation as they would determine if anesthesia related & if not) 12:19am: she spoke to anesthesia & told that patient did not have epidural - there was error in her shift report             (I told I would come in to evaluate now)   0100: In to evaluate patient  S: reports intermittent chest pain (points to epigastric region)       states "takes her breath when stands up"  - no SOB      no nausea or vomiting      no bowel movement      some radiation into neck and head and back       only hurts getting out of bed and walking around      not slept any today & baby cluster nursing and crying a lot tonight      no neck or headache pain since taking pain medications tonight      no calf pain or leg pain with ambulation  O:  VS stable       CBC today normal / no anemia       chart review: GDMa2 with poor compliance / cannabis daily use (no UDS)       alert and oriented / no acute distress or malaise / behavior appropriate       O2 sat normal       up in bathroom on arrival to room - states "feel ok"       chart review - hx  daily cannabis abuse early pregnancy       abdomen soft and non-tender       fundus firm       bleeding small -moderate in BR for pad change  A/P:  no acute distress          epigastric pain - unknown etiology with differential of gas pain (history of chronic constipation), cholecystis or cholelithiasis (no vomiting or relation to meals), gastroparesis, withdrawal (hx daily cannabis use without current screening)                                     no indication cardiac or respiratory etiology based on current  assessment - will continue to monitor          neck pain and headache - likely etiology of tension and sleep deprivation                  continue water hydration and analgesia PRN         give Magnesium and Miralax dose tonight         check CMP now / fasting CBG in am         encouraged sleep tonight         K-pad PRN         consider UDS with any concern for withdrawal         re-evaluate on rounds in AM  Marlinda Mike CNM [facility]

## 2015-12-11 NOTE — Lactation Note (Signed)
This note was copied from a baby's chart. Lactation Consultation Note: follow up with patient upon discharge: mother has questions about positioning infant at breast for better latch. Mother to continue to do skin to skin and to feed infant 8-12 feedings in 24 hours. Mother advised to do good massage and ice breast to decrease breast swelling for better flow to baby. Mother informed of all breastfeeding resources and community support.   Patient Name: Girl Charlane Ferrettittiesha Bisono AVWUJ'WToday's Date: 12/11/2015 Reason for consult: Follow-up assessment   Maternal Data    Feeding    LATCH Score/Interventions                      Lactation Tools Discussed/Used     Consult Status Consult Status: Complete    Michel BickersKendrick, Noble Cicalese McCoy 12/11/2015, 10:27 AM

## 2015-12-11 NOTE — Lactation Note (Signed)
This note was copied from a baby's chart. Lactation Consultation Note Mom states baby is cluster feeding and doing well. Mom hopes to go home today. Has DEBP. Plans to BF, will have to pump and bottle feed when she returns to school in the fall. Discussed filling, engorgement, breast care, I&O supply and demand. Mom denies and difficulty at this time. Baby good output. Had 3% weight loss. Reminded of LC op services.  Patient Name: Girl Charlane Ferrettittiesha Fedder ZOXWR'UToday's Date: 12/11/2015 Reason for consult: Follow-up assessment   Maternal Data    Feeding Feeding Type: Breast Fed Length of feed: 20 min  LATCH Score/Interventions Latch: Grasps breast easily, tongue down, lips flanged, rhythmical sucking.     Type of Nipple: Everted at rest and after stimulation              Lactation Tools Discussed/Used     Consult Status Consult Status: Complete Date: 12/11/15    [name], Danyah Guastella G 12/11/2015, 3:57 AM

## 2015-12-11 NOTE — Progress Notes (Addendum)
Post Partum Day #2            Information for the patient's newborn:  Molli KnockHudson, Girl Brynlie [[id number]][[id number]]  female "[name]"  Feeding: breast  Subjective: No HA, SOB, CP, F/C, breast symptoms. Pain minimal. Episode of heartburn/anxiety last night not repeated, resolved after pain meds and Maalox.  Normal vaginal bleeding, no clots.      Objective:  Temp:  [98 F (36.7 C)-98.6 F (37 C)] 98.6 F (37 C) (07/14 0615) Pulse Rate:  [68-88] 68 (07/14 0615) Resp:  [17-18] 17 (07/14 0615) BP: (101-119)/(42-68) 119/68 mmHg (07/14 0615) SpO2:  [99 %] 99 % (07/13 2330)  No intake or output data in the 24 hours ending 12/11/15 1009     Recent Labs  12/09/15 0845 12/10/15 0515  WBC 10.7* 17.4*  HGB 11.9* 10.4*  HCT 35.4* 31.0*  PLT 229 231    Blood type: --/--/A POS, A POS (07/12 0845) Rubella: Immune (12/27 0000)    Physical Exam:  General: alert, cooperative and no distress Uterine Fundus: firm Lochia: appropriate Perineum: repair intact, edema minimal DVT Evaluation: +1 pedal edema, symetric    Assessment/Plan: PPD # 2 / 20 y.o., G1P1001 S/P:induced vaginal   Principal Problem:   Postpartum care following vaginal delivery (7/12) Active Problems:   Pregnancy   Gestational diabetes mellitus, class A2   Indication for care in labor or delivery    normal postpartum exam  Continue current postpartum care  D/C home   LOS: 2 days   Neta Mendsaniela C Paul, CNM, MSN 12/11/2015, 10:09 AM

## 2019-03-26 ENCOUNTER — Other Ambulatory Visit: Payer: Self-pay

## 2019-03-26 ENCOUNTER — Encounter (HOSPITAL_COMMUNITY): Payer: Self-pay | Admitting: Emergency Medicine

## 2019-03-26 ENCOUNTER — Emergency Department (HOSPITAL_COMMUNITY)
Admission: EM | Admit: 2019-03-26 | Discharge: 2019-03-27 | Discharge status: Against Medical Advice | Payer: BLUE CROSS/BLUE SHIELD | Attending: Emergency Medicine | Admitting: Emergency Medicine

## 2019-03-26 ENCOUNTER — Emergency Department (HOSPITAL_COMMUNITY): Payer: BLUE CROSS/BLUE SHIELD

## 2019-03-26 DIAGNOSIS — Z5321 Procedure and treatment not carried out due to patient leaving prior to being seen by health care provider: Secondary | ICD-10-CM | POA: Insufficient documentation

## 2019-03-26 DIAGNOSIS — R0789 Other chest pain: Secondary | ICD-10-CM | POA: Diagnosis present

## 2019-03-26 LAB — BASIC METABOLIC PANEL
Anion gap: 9 (ref 5–15)
BUN: 7 mg/dL (ref 6–20)
CO2: 26 mmol/L (ref 22–32)
Calcium: 9 mg/dL (ref 8.9–10.3)
Chloride: 103 mmol/L (ref 98–111)
Creatinine, Ser: 0.68 mg/dL (ref 0.44–1.00)
GFR calc Af Amer: >60 mL/min (ref >60)
GFR calc non Af Amer: >60 mL/min (ref >60)
Glucose, Bld: 124 mg/dL — ABNORMAL HIGH (ref 70–99)
Potassium: 3.8 mmol/L (ref 3.5–5.1)
Sodium: 138 mmol/L (ref 135–145)

## 2019-03-26 LAB — CBC
HCT: 40 % (ref 36.0–46.0)
Hemoglobin: 14.2 g/dL (ref 12.0–15.0)
MCH: 33.3 pg (ref 26.0–34.0)
MCHC: 35.5 g/dL (ref 30.0–36.0)
MCV: 93.9 fL (ref 80.0–100.0)
Platelets: 301 10*3/uL (ref 150–400)
RBC: 4.26 MIL/uL (ref 3.87–5.11)
RDW: 12.2 % (ref 11.5–15.5)
WBC: 11.3 10*3/uL — ABNORMAL HIGH (ref 4.0–10.5)
nRBC: 0 % (ref 0.0–0.2)

## 2019-03-26 LAB — I-STAT BETA HCG BLOOD, ED (MC, WL, AP ONLY): I-stat hCG, quantitative: <5 m[IU]/mL (ref <5)

## 2019-03-26 LAB — TROPONIN I (HIGH SENSITIVITY): Troponin I (High Sensitivity): <2 ng/L (ref <18)

## 2019-03-26 MED ORDER — SODIUM CHLORIDE 0.9% FLUSH: 3.0000 mL | Freq: Once | INTRAVENOUS | Status: DC

## 2019-03-26 NOTE — ED Triage Notes (Signed)
Patient reports central chest pain with mild nausea onset this morning , denies SOB /no diaphoresis , pain radiating to mid back .

## 2019-03-27 ENCOUNTER — Encounter (HOSPITAL_COMMUNITY): Payer: Self-pay

## 2019-03-27 ENCOUNTER — Emergency Department (HOSPITAL_COMMUNITY)
Admission: EM | Admit: 2019-03-27 | Discharge: 2019-03-27 | Disposition: A | Discharge status: Home / Self Care | Payer: BLUE CROSS/BLUE SHIELD | Source: Home / Self Care | Attending: Emergency Medicine | Admitting: Emergency Medicine

## 2019-03-27 ENCOUNTER — Other Ambulatory Visit: Payer: Self-pay

## 2019-03-27 ENCOUNTER — Emergency Department (HOSPITAL_COMMUNITY): Payer: BLUE CROSS/BLUE SHIELD

## 2019-03-27 DIAGNOSIS — M549 Dorsalgia, unspecified: Secondary | ICD-10-CM | POA: Insufficient documentation

## 2019-03-27 DIAGNOSIS — R0789 Other chest pain: Secondary | ICD-10-CM

## 2019-03-27 DIAGNOSIS — Z79899 Other long term (current) drug therapy: Secondary | ICD-10-CM | POA: Insufficient documentation

## 2019-03-27 DIAGNOSIS — R079 Chest pain, unspecified: Secondary | ICD-10-CM

## 2019-03-27 DIAGNOSIS — R110 Nausea: Secondary | ICD-10-CM | POA: Insufficient documentation

## 2019-03-27 DIAGNOSIS — Y92009 Unspecified place in unspecified non-institutional (private) residence as the place of occurrence of the external cause: Secondary | ICD-10-CM | POA: Insufficient documentation

## 2019-03-27 DIAGNOSIS — M542 Cervicalgia: Secondary | ICD-10-CM | POA: Insufficient documentation

## 2019-03-27 LAB — CBC
HCT: 39.9 % (ref 36.0–46.0)
Hemoglobin: 13 g/dL (ref 12.0–15.0)
MCH: 31 pg (ref 26.0–34.0)
MCHC: 32.6 g/dL (ref 30.0–36.0)
MCV: 95.2 fL (ref 80.0–100.0)
Platelets: 266 10*3/uL (ref 150–400)
RBC: 4.19 MIL/uL (ref 3.87–5.11)
RDW: 12.3 % (ref 11.5–15.5)
WBC: 10.8 10*3/uL — ABNORMAL HIGH (ref 4.0–10.5)
nRBC: 0 % (ref 0.0–0.2)

## 2019-03-27 LAB — BASIC METABOLIC PANEL
Anion gap: 8 (ref 5–15)
BUN: 8 mg/dL (ref 6–20)
CO2: 26 mmol/L (ref 22–32)
Calcium: 8.9 mg/dL (ref 8.9–10.3)
Chloride: 103 mmol/L (ref 98–111)
Creatinine, Ser: 0.58 mg/dL (ref 0.44–1.00)
GFR calc Af Amer: >60 mL/min (ref >60)
GFR calc non Af Amer: >60 mL/min (ref >60)
Glucose, Bld: 105 mg/dL — ABNORMAL HIGH (ref 70–99)
Potassium: 3.6 mmol/L (ref 3.5–5.1)
Sodium: 137 mmol/L (ref 135–145)

## 2019-03-27 LAB — POCT PREGNANCY, URINE: Preg Test, Ur: NEGATIVE

## 2019-03-27 LAB — TROPONIN I (HIGH SENSITIVITY)
Troponin I (High Sensitivity): <2 ng/L (ref <18)
Troponin I (High Sensitivity): <2 ng/L (ref <18)
Troponin I (High Sensitivity): <2 ng/L (ref <18)

## 2019-03-27 MED ORDER — PANTOPRAZOLE SODIUM 20 MG PO TBEC: 40.0000 mg | DELAYED_RELEASE_TABLET | Freq: Every day | ORAL | 0 refills | Status: AC

## 2019-03-27 MED ORDER — CYCLOBENZAPRINE HCL 10 MG PO TABS: 10.0000 mg | ORAL_TABLET | Freq: Two times a day (BID) | ORAL | 0 refills | Status: AC | PRN

## 2019-03-27 NOTE — ED Triage Notes (Signed)
Pt states sternal chest pain that she describes as sharp. Pt states that it has gotten worse. Pt left [facility] due to wait times.

## 2019-03-27 NOTE — ED Notes (Signed)
Pt states the wait is too long and she cant wait.

## 2019-03-27 NOTE — ED Notes (Signed)
Pt wants to be removed from system. Checking in at [facility] ED.

## 2019-03-27 NOTE — ED Provider Notes (Signed)
[facility]-EMERGENCY DEPT Provider Note   CSN: [id number] Arrival date & time: 03/27/19  1520     History   Chief Complaint Chief Complaint  Patient presents with  . Chest Pain    HPI Rhonda Cisneros is a 23 y.o. female.     HPI   Presents with concern for chest pain, stabbing chest pain, began 2 days ago, worsened yesterday and severe last night, constant nonstop pain for hte last 2 days. Worse with movements, twisting, lifting arms, head, speaking, laughing. Also having upper back pain.  Feels it in neck also.  Shoots up and down back when doing certain things. Sometimes feels it in arm, once in hand.  Has felt short of breath for 12 hours.  Nausea yesterday a few times then resolved. No vomiting. No abdominal pain.  No fevers or cough. Pain with swallowing as well. Sometimes better leaning sideways to right, if lean forward back hurts and if lay back chest hurts.  No trauma/falls/increased activity. No leg pain or swelling.  No OCPs, PCOS hx, not known if pregnant but does not think so, no recent surgeries/long trips car or airplane, no hx of DVT/PE  No smoking, etoh, drugs  No family hx of early heart disease, mom has sarcoidosis, grandma hx of breast ca    Past Medical History:  Diagnosis Date  . Gestational diabetes    metformin  . Gestational diabetes mellitus, class A2 12/09/2015  . Indication for care in labor or delivery 12/09/2015  . Medical history non-contributory   . Postpartum care following vaginal delivery (7/12) 12/09/2015    Patient Active Problem List   Diagnosis Date Noted  . Pregnancy 12/09/2015  . Gestational diabetes mellitus, class A2 12/09/2015  . Indication for care in labor or delivery 12/09/2015  . Postpartum care following vaginal delivery (7/12) 12/09/2015    Past Surgical History:  Procedure Laterality Date  . EYE SURGERY       OB History    Gravida  1   Para  1   Term  1   Preterm      AB      Living   1     SAB      TAB      Ectopic      Multiple  0   Live Births  1            Home Medications    Prior to Admission medications   Medication Sig Start Date End Date Taking? Authorizing Provider  acetaminophen (TYLENOL) 325 MG tablet Take 2 tablets (650 mg total) by mouth every 4 (four) hours as needed (for pain scale < 4). 12/11/15   Neta Mends, CNM  coconut oil OIL Apply 1 application topically as needed. 12/11/15   Neta Mends, CNM  cyclobenzaprine (FLEXERIL) 10 MG tablet Take 1 tablet (10 mg total) by mouth 2 (two) times daily as needed for muscle spasms. 03/27/19   Alvira Monday, MD  ibuprofen (ADVIL,MOTRIN) 600 MG tablet Take 1 tablet (600 mg total) by mouth every 6 (six) hours. 12/11/15   Neta Mends, CNM  magnesium oxide (MAG-OX) 400 (241.3 Mg) MG tablet Take 1 tablet (400 mg total) by mouth daily. 12/11/15   Neta Mends, CNM  pantoprazole (PROTONIX) 20 MG tablet Take 2 tablets (40 mg total) by mouth daily for 14 days. 03/27/19 04/10/19  Alvira Monday, MD  polyethylene glycol (MIRALAX / GLYCOLAX) packet Take 17 g by mouth daily  as needed. 12/11/15   Juliene Pina, CNM  Prenatal Vit-Fe Fumarate-FA (PRENATAL MULTIVITAMIN) TABS tablet Take 1 tablet by mouth daily at 12 noon.    [provider]    Family History Family History  Problem Relation Age of Onset  . Anemia Mother   . Sickle cell trait Mother   . Hypertension Paternal Grandmother   . Cancer Paternal Grandmother   . Alcohol abuse Neg Hx   . Arthritis Neg Hx   . Asthma Neg Hx   . Birth defects Neg Hx   . COPD Neg Hx   . Depression Neg Hx   . Diabetes Neg Hx   . Drug abuse Neg Hx   . Early death Neg Hx   . Hearing loss Neg Hx   . Heart disease Neg Hx   . Hyperlipidemia Neg Hx   . Kidney disease Neg Hx   . Learning disabilities Neg Hx   . Mental illness Neg Hx   . Mental retardation Neg Hx   . Miscarriages / Stillbirths Neg Hx   . Stroke Neg Hx   . Vision loss Neg Hx    . Varicose Veins Neg Hx     Social History Social History   Tobacco Use  . Smoking status: Never Smoker  . Smokeless tobacco: Never Used  Substance Use Topics  . Alcohol use: No  . Drug use: No     Allergies   Patient has no known allergies.   Review of Systems Review of Systems  Constitutional: Negative for fever.  HENT: Negative for sore throat.   Eyes: Negative for visual disturbance.  Respiratory: Positive for shortness of breath. Negative for cough.   Cardiovascular: Positive for chest pain.  Gastrointestinal: Negative for abdominal pain, diarrhea, nausea (yesterday) and vomiting.  Genitourinary: Negative for difficulty urinating.  Musculoskeletal: Positive for back pain and neck pain.  Skin: Negative for rash.  Neurological: Negative for syncope, light-headedness and headaches.     Physical Exam Updated Vital Signs BP (!) 146/79   Pulse 94   Temp 98.6 F (37 C) (Oral)   Resp (!) 21   Wt 121.6 kg   SpO2 100%   BMI 43.27 kg/m   Physical Exam Vitals signs and nursing note reviewed.  Constitutional:      General: She is not in acute distress.    Appearance: She is well-developed. She is not diaphoretic.  HENT:     Head: Normocephalic and atraumatic.  Eyes:     Conjunctiva/sclera: Conjunctivae normal.  Neck:     Musculoskeletal: Normal range of motion.  Cardiovascular:     Rate and Rhythm: Normal rate and regular rhythm.     Heart sounds: Normal heart sounds. No murmur. No friction rub. No gallop.   Pulmonary:     Effort: Pulmonary effort is normal. No respiratory distress.     Breath sounds: Normal breath sounds. No wheezing or rales.  Chest:     Chest wall: Tenderness present.  Abdominal:     General: There is no distension.     Palpations: Abdomen is soft.     Tenderness: There is no abdominal tenderness. There is no guarding.  Musculoskeletal:        General: No tenderness.  Skin:    General: Skin is warm and dry.     Findings: No  erythema or rash.  Neurological:     Mental Status: She is alert and oriented to person, place, and time.  ED Treatments / Results  Labs (all labs ordered are listed, but only abnormal results are displayed) Labs Reviewed  BASIC METABOLIC PANEL - Abnormal; Notable for the following components:      Result Value   Glucose, Bld 105 (*)    All other components within normal limits  CBC - Abnormal; Notable for the following components:   WBC 10.8 (*)    All other components within normal limits  POCT PREGNANCY, URINE  TROPONIN I (HIGH SENSITIVITY)  TROPONIN I (HIGH SENSITIVITY)    EKG EKG Interpretation  Date/Time:  Wednesday March 27 2019 15:28:43 EDT Ventricular Rate:  89 PR Interval:    QRS Duration: 90 QT Interval:  354 QTC Calculation: 431 R Axis:   72 Text Interpretation: Sinus rhythm Consider left atrial enlargement Baseline wander in lead(s) II No significant change since last tracing Confirmed by Alvira MondaySchlossman, Aidynn Krenn ([id number]) on 03/27/2019 7:03:27 PM   Radiology Dg Chest 2 View  Result Date: 03/27/2019 CLINICAL DATA:  Chest pain EXAM: CHEST - 2 VIEW COMPARISON:  March 26, 2019 FINDINGS: The heart size and mediastinal contours are within normal limits. Both lungs are clear. The visualized skeletal structures are unremarkable. IMPRESSION: No active cardiopulmonary disease. Electronically Signed   By: Guadlupe SpanishPraneil  Patel M.D.   On: 03/27/2019 15:51    Procedures Procedures (including critical care time)  Medications Ordered in ED Medications - No data to display   Initial Impression / Assessment and Plan / ED Course  I have reviewed the triage vital signs and the nursing notes.  Pertinent labs & imaging results that were available during my care of the patient were reviewed by me and considered in my medical decision making (see chart for details).        23yo female with no significant medical history presents with concern for chest pain for 2 days.   Differential diagnosis for chest pain includes pulmonary embolus, dissection, pneumothorax, pneumonia, ACS, myocarditis, pericarditis.  EKG was done and evaluate by me and showed no acute ST changes and no signs of pericarditis. Chest x-ray was done and evaluated by me and radiology and showed no sign of pneumonia or pneumothorax.  Low suspicion for PE given no hypoxia, no risk factors, and pt with chest wall tenderness feel this is more likely.  Patient is low risk HEART score and had delta troponins which were both negative. Normal bilateral UE and LE pulses, given duration of symptoms and tenderness on exam, doubt dissection  Patient most likely with musculoskeletal chest pain given pain with palpation as well as her movements, however also consider possible esophageal pathology given pain with swallowing. Given rx for PPI, flexeril. Patient discharged in stable condition with understanding of reasons to return and recommend PCP follow up.   Final Clinical Impressions(s) / ED Diagnoses   Final diagnoses:  Nonspecific chest pain  Chest wall pain    ED Discharge Orders         Ordered    cyclobenzaprine (FLEXERIL) 10 MG tablet  2 times daily PRN     03/27/19 2015    pantoprazole (PROTONIX) 20 MG tablet  Daily     03/27/19 2015           Alvira MondaySchlossman, Pharrell Ledford, MD 03/28/19 2215

## 2021-03-06 IMAGING — CR DG CHEST 2V
2 series · 2 of 2 positions shown · non-contrast
Comparison: None.

CLINICAL DATA: Chest pain

EXAM:
CHEST - 2 VIEW

[chest pa]
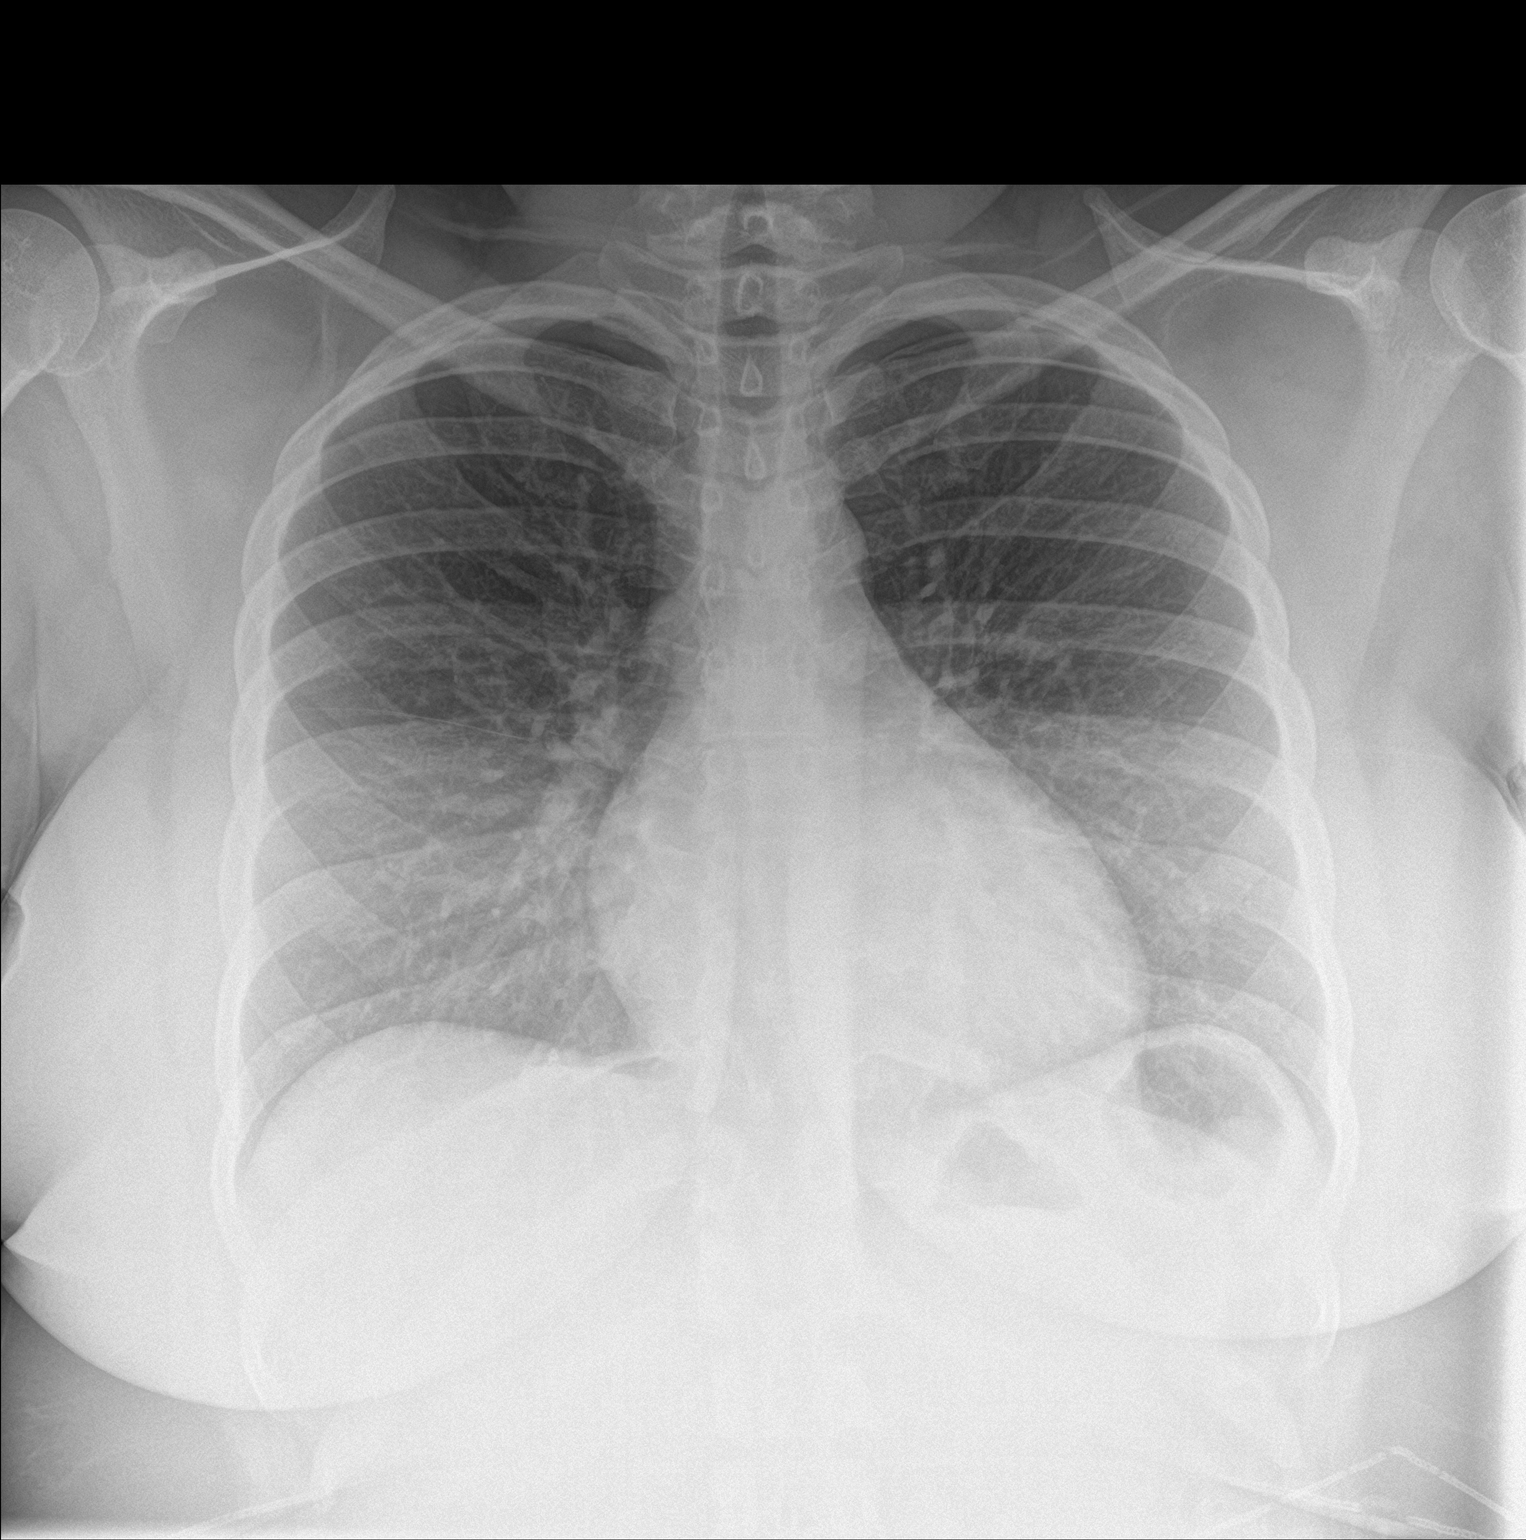

[chest lat]
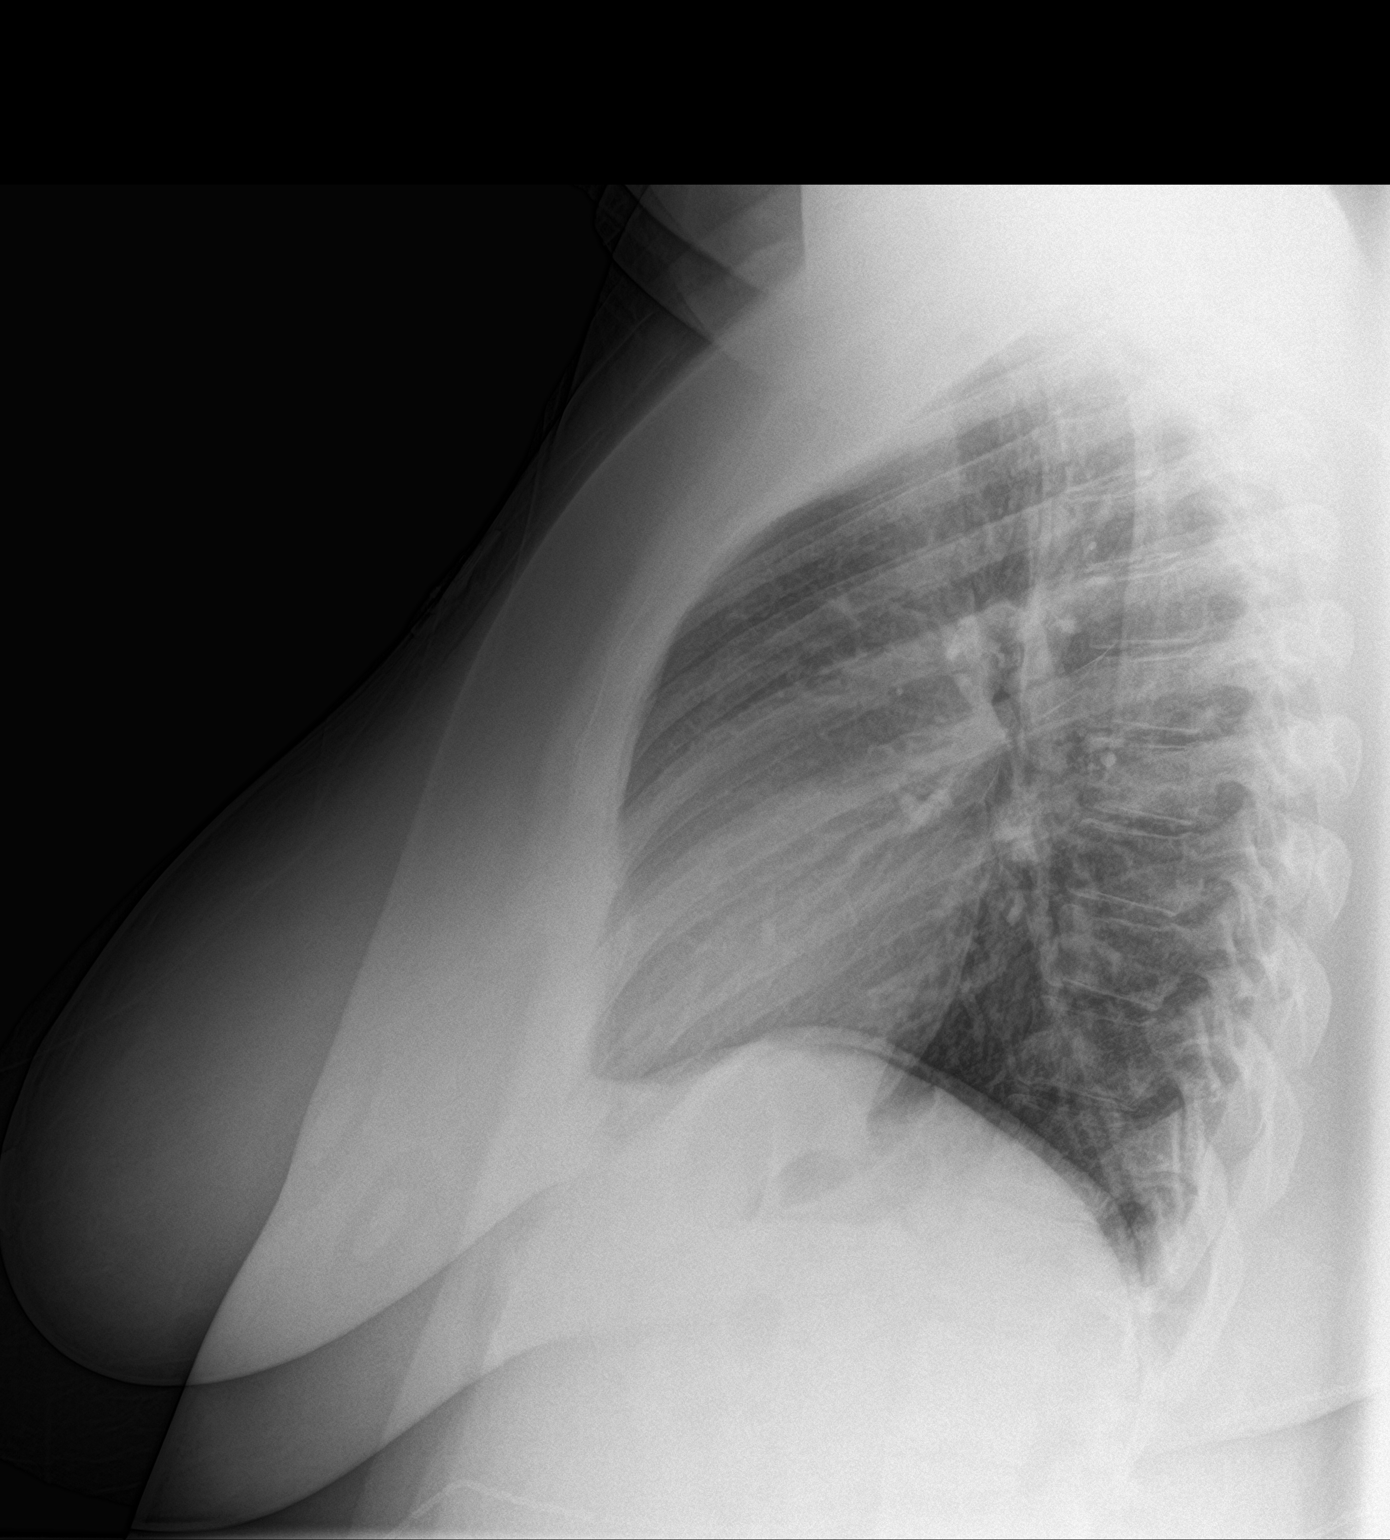

[2 of 2 positions shown; findings below may reference images not displayed]

FINDINGS: Lungs are clear. Heart size and pulmonary vascularity are normal. No
adenopathy. No pneumothorax. No bone lesions.
IMPRESSION: No edema or consolidation.

## 2021-03-07 IMAGING — CR DG CHEST 2V
2 series · 2 of 2 positions shown · non-contrast
Comparison: March 26, 2019

CLINICAL DATA: Chest pain

EXAM:
CHEST - 2 VIEW

[w chest pa]
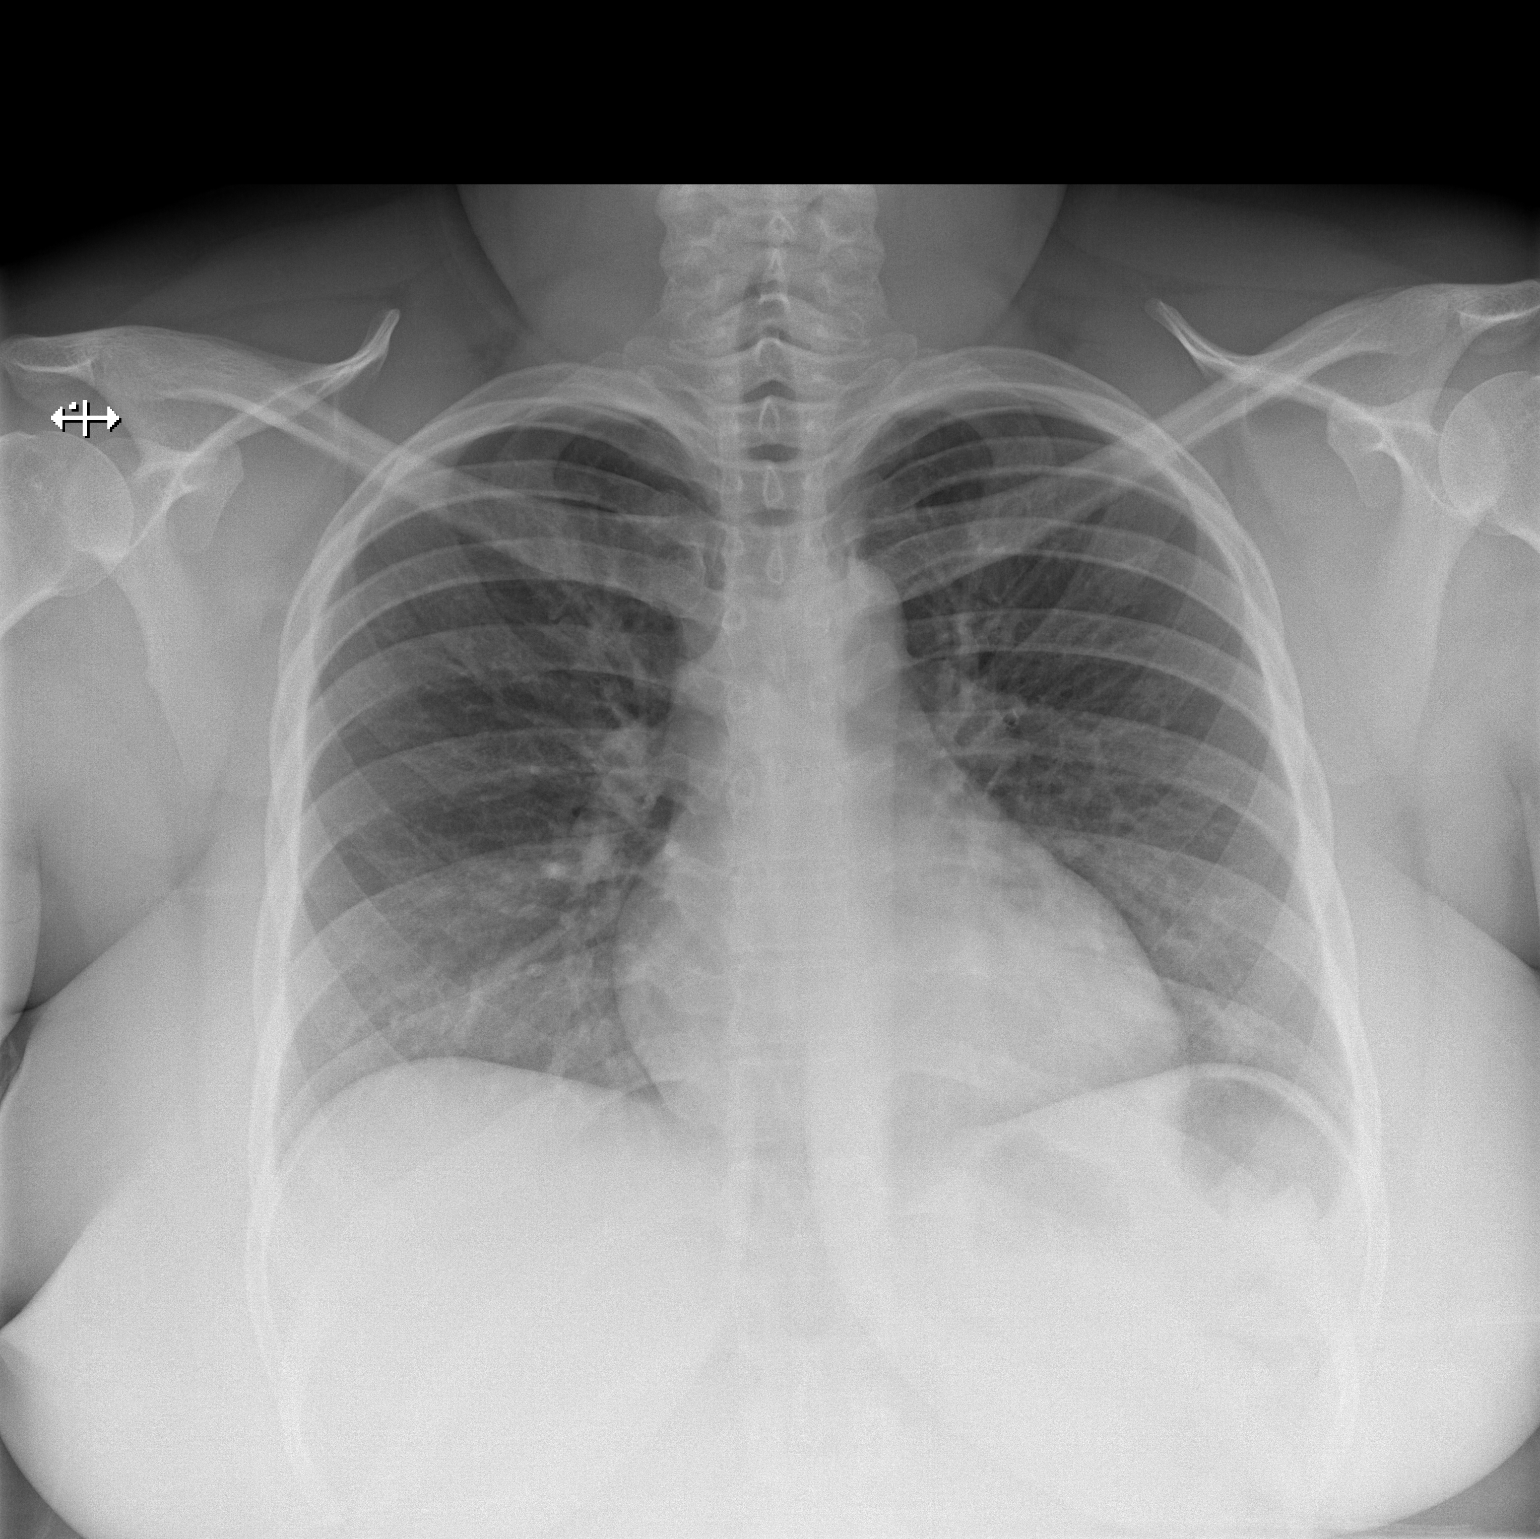

[w chest lat]
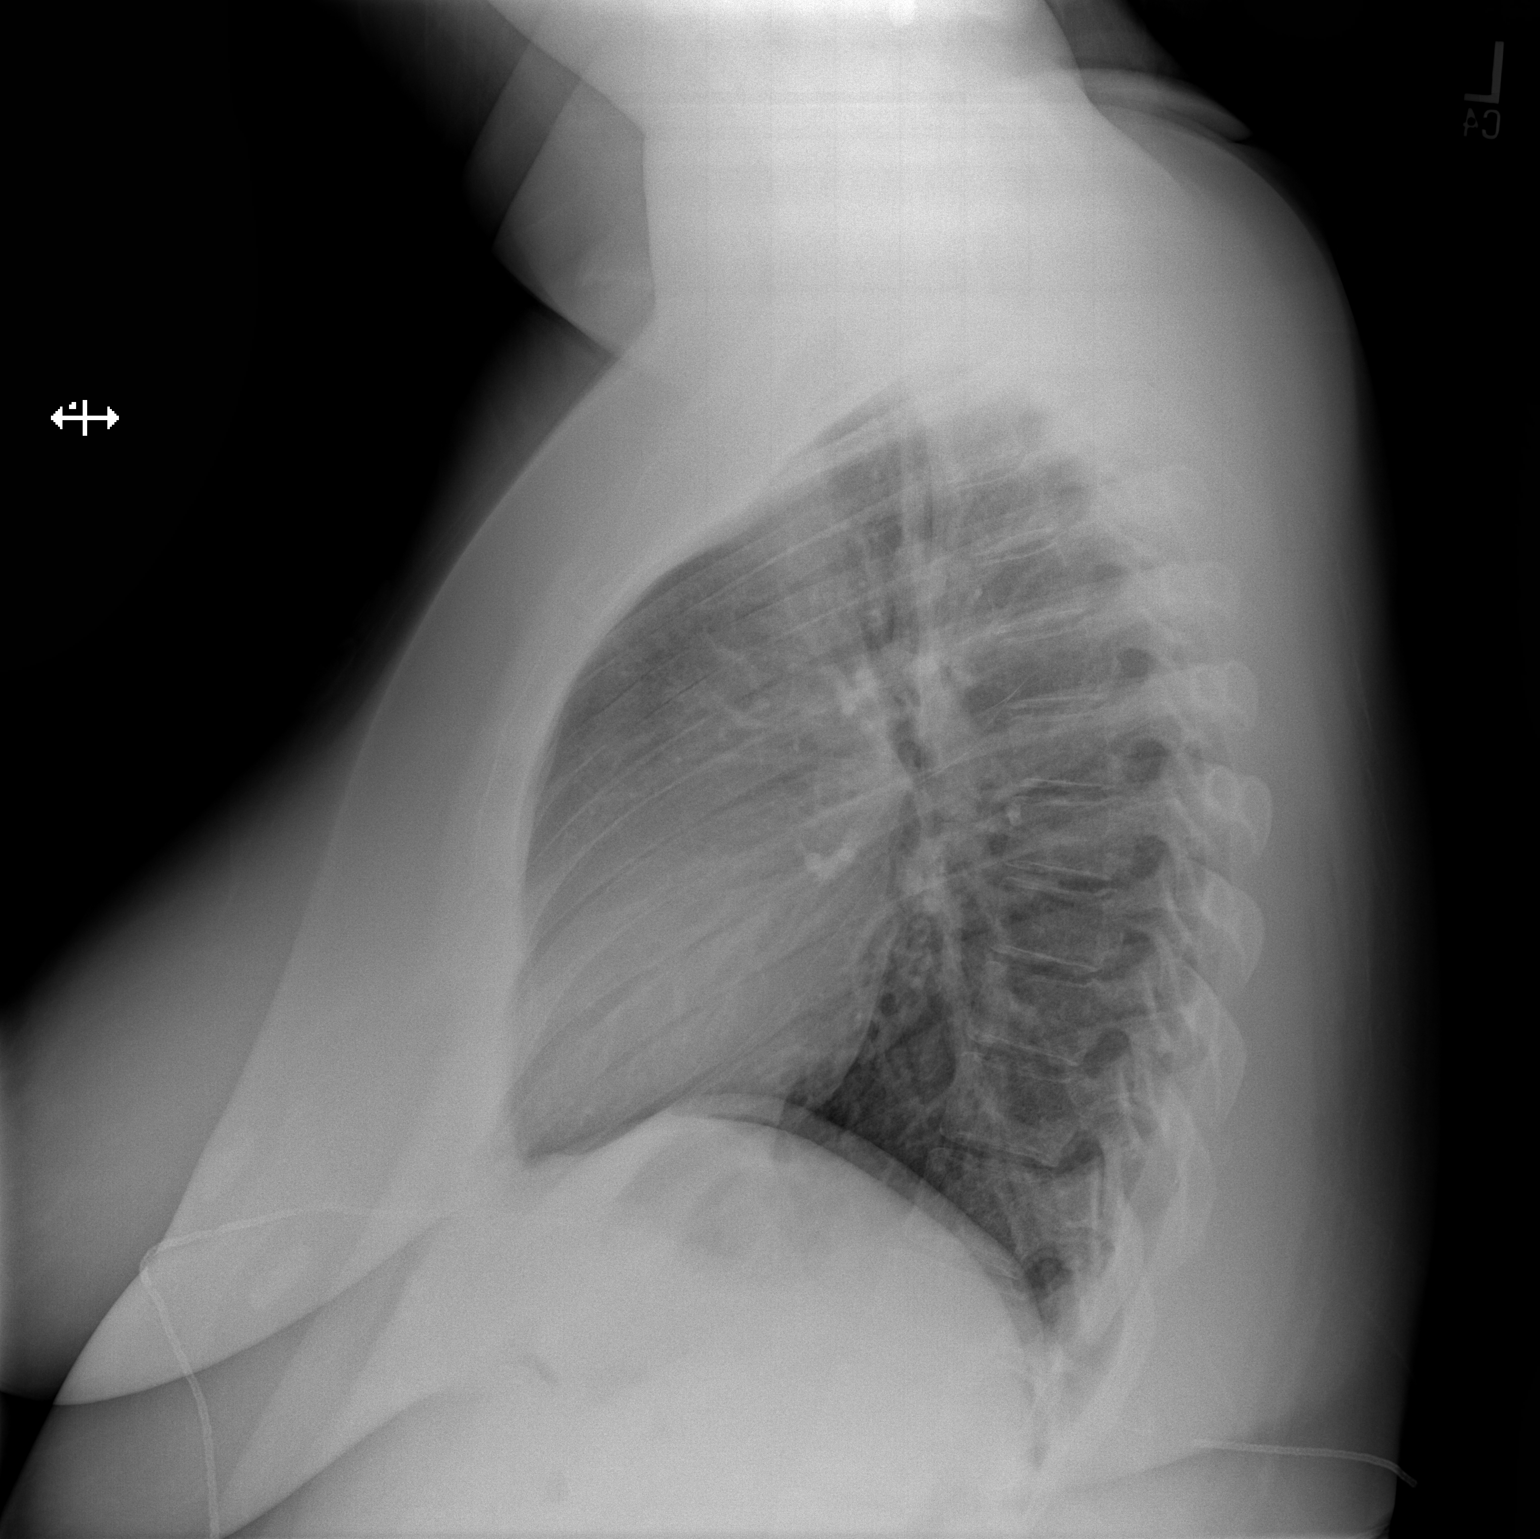

[2 of 2 positions shown; findings below may reference images not displayed]

FINDINGS: The heart size and mediastinal contours are within normal limits.
Both lungs are clear. The visualized skeletal structures are
unremarkable.
IMPRESSION: No active cardiopulmonary disease.

## 2021-09-01 ENCOUNTER — Emergency Department (HOSPITAL_COMMUNITY): Payer: BLUE CROSS/BLUE SHIELD

## 2021-09-01 ENCOUNTER — Emergency Department (HOSPITAL_COMMUNITY)
Admission: EM | Admit: 2021-09-01 | Discharge: 2021-09-01 | Disposition: A | Discharge status: Home / Self Care | Payer: BLUE CROSS/BLUE SHIELD | Attending: Emergency Medicine | Admitting: Emergency Medicine

## 2021-09-01 ENCOUNTER — Encounter (HOSPITAL_COMMUNITY): Payer: Self-pay | Admitting: Emergency Medicine

## 2021-09-01 DIAGNOSIS — O26891 Other specified pregnancy related conditions, first trimester: Secondary | ICD-10-CM | POA: Insufficient documentation

## 2021-09-01 DIAGNOSIS — Z3A01 Less than 8 weeks gestation of pregnancy: Secondary | ICD-10-CM | POA: Diagnosis not present

## 2021-09-01 DIAGNOSIS — O219 Vomiting of pregnancy, unspecified: Secondary | ICD-10-CM | POA: Insufficient documentation

## 2021-09-01 DIAGNOSIS — R102 Pelvic and perineal pain: Secondary | ICD-10-CM | POA: Insufficient documentation

## 2021-09-01 LAB — CBC
HCT: 38.2 % (ref 36.0–46.0)
Hemoglobin: 13.2 g/dL (ref 12.0–15.0)
MCH: 32 pg (ref 26.0–34.0)
MCHC: 34.6 g/dL (ref 30.0–36.0)
MCV: 92.7 fL (ref 80.0–100.0)
Platelets: 317 10*3/uL (ref 150–400)
RBC: 4.12 MIL/uL (ref 3.87–5.11)
RDW: 12.5 % (ref 11.5–15.5)
WBC: 12.2 10*3/uL — ABNORMAL HIGH (ref 4.0–10.5)
nRBC: 0 % (ref 0.0–0.2)

## 2021-09-01 LAB — BASIC METABOLIC PANEL
Anion gap: 5 (ref 5–15)
BUN: 10 mg/dL (ref 6–20)
CO2: 26 mmol/L (ref 22–32)
Calcium: 8.8 mg/dL — ABNORMAL LOW (ref 8.9–10.3)
Chloride: 107 mmol/L (ref 98–111)
Creatinine, Ser: 0.58 mg/dL (ref 0.44–1.00)
GFR, Estimated: >60 mL/min (ref >60)
Glucose, Bld: 110 mg/dL — ABNORMAL HIGH (ref 70–99)
Potassium: 3.7 mmol/L (ref 3.5–5.1)
Sodium: 138 mmol/L (ref 135–145)

## 2021-09-01 LAB — URINALYSIS, ROUTINE W REFLEX MICROSCOPIC
Bilirubin Urine: NEGATIVE
Glucose, UA: NEGATIVE mg/dL
Hgb urine dipstick: NEGATIVE
Ketones, ur: NEGATIVE mg/dL
Leukocytes,Ua: NEGATIVE
Nitrite: NEGATIVE
Protein, ur: NEGATIVE mg/dL
Specific Gravity, Urine: 1.02 (ref 1.005–1.030)
pH: 6 (ref 5.0–8.0)

## 2021-09-01 LAB — HCG, QUANTITATIVE, PREGNANCY: hCG, Beta Chain, Quant, S: 1955 m[IU]/mL — ABNORMAL HIGH (ref <5)

## 2021-09-01 LAB — WET PREP, GENITAL
Sperm: NONE SEEN
Trich, Wet Prep: NONE SEEN
WBC, Wet Prep HPF POC: <10 (ref <10)
Yeast Wet Prep HPF POC: NONE SEEN

## 2021-09-01 NOTE — ED Triage Notes (Signed)
Per pt, states her LMP was 2/25-states she took a home pregnancy test last Tuesday and it was positive-states she started having some cramping on and off-no bleeding ?

## 2021-09-01 NOTE — Discharge Instructions (Addendum)
Your US showed a probable gestational sac in the uterus.  You are approximately [redacted] weeks pregnant by her last menstrual period.  Because it is so early it is important to get a repeat quantitative hCG exam to make sure that you are pregnancy hormones are trending upward appropriately and to assess for viability of the pregnancy.  Please begin taking prenatal vitamins which you can purchase over-the-counter.  Make sure that you are also taking Tylenol only and avoid Aleve, Advil or aspirin. ?Get help right away if: ?You have sudden severe pain. ?Your pain gets steadily worse. ?You have severe pain along with fever, nausea, vomiting, or excessive sweating. ?You lose consciousness. ?

## 2021-09-01 NOTE — ED Provider Notes (Signed)
?  Care of patient assumed from PA Harris at 1530.  Agree with history, physical exam and plan.  See their note for further details. Briefly, 26 year old female presents to the emergency department with a chief complaint of abdominal cramping x2 weeks.  Cramping has gotten progressively worse over time.  Patient has had 2 days of vomiting last week.  Patient reports that he took a pregnancy last week which was positive. ? ? ?Physical Exam  ?BP 105/69   Pulse 81   Temp 98.3 ?F (36.8 ?C) (Oral)   Resp 18   LMP 07/24/2021   SpO2 100%  ? ?Physical Exam ?Vitals and nursing note reviewed.  ?Constitutional:   ?   General: She is not in acute distress. ?   Appearance: She is not ill-appearing, toxic-appearing or diaphoretic.  ?HENT:  ?   Head: Normocephalic.  ?Eyes:  ?   General: No scleral icterus.    ?   Right eye: No discharge.     ?   Left eye: No discharge.  ?Cardiovascular:  ?   Rate and Rhythm: Normal rate.  ?Pulmonary:  ?   Effort: Pulmonary effort is normal.  ?Abdominal:  ?   General: Abdomen is flat. There is no distension. There are no signs of injury.  ?   Palpations: Abdomen is soft. There is no mass or pulsatile mass.  ?   Tenderness: There is no abdominal tenderness. There is no guarding or rebound.  ?   Hernia: There is no hernia in the umbilical area or ventral area.  ?Skin: ?   General: Skin is warm and dry.  ?Neurological:  ?   General: No focal deficit present.  ?   Mental Status: She is alert.  ?Psychiatric:     ?   Behavior: Behavior is cooperative.  ? ? ?Procedures  ?Procedures ? ?ED Course / MDM  ? ?Clinical Course as of 09/01/21 1644  ?Wed Sep 01, 2021  ?1557 hCG, quantitative, pregnancy(!) [AH]  ?1557 Wet prep, genital(!) [AH]  ?1557 CBC(!) [AH]  ?1557 Basic metabolic panel(!) [AH]  ?  ?Clinical Course User Index ?[AH] Arthor Captain, PA-C  ? ?Medical Decision Making ?Amount and/or Complexity of Data Reviewed ?Labs: ordered. Decision-making details documented in ED Course. ?Radiology:  ordered. ? ? ?At time of handoff urinalysis is pending.  Patient will need to follow-up with OB/GYN provider for repeat beta hCG levels as well as follow-up ultrasound in 14 days. ? ?Urinalysis shows no signs of infection.  Negative for protein. ? ?I discussed patient having clue cells present which is consistent with bacterial vaginosis.  Patient denies any vaginal discharge at this time.  Shared decision making with patient about treatment for BV at this time.  Patient elects to defer any treatment however will follow-up with PCP or OB/GYN if she starts having vaginal discharge. ? ? ? ? ?  ?Haskel Schroeder, PA-C ?09/01/21 1758 ? ?  ?Arby Barrette, MD ?09/09/21 [phone] ? ?

## 2021-09-01 NOTE — ED Provider Notes (Signed)
?[facility] DEPT ?Provider Note ? ? ?CSN: [id number] ?Arrival date & time: 09/01/21  1217 ? ?  ? ?History ? ?Chief Complaint  ?Patient presents with  ? Abdominal Cramping  ? ? ?Rhonda Cisneros is a 26 y.o. female who presents with abdominal cramping x 2 weeks. It has progressively worsened. She had 2 days of vomiting last week. She took a pregnancy test last week that was positive. She states the pain feels like the "worst cramps of my life." She had pain with intercourse a few days ago. She denies bleeding, discharge, foul odor, urinary sxs. She has been taking tylenol without relief. She felt the pain was severe today and came in. ? ? ? ?Abdominal Cramping ? ? ?  ? ?Home Medications ?Prior to Admission medications   ?Medication Sig Start Date End Date Taking? Authorizing Provider  ?acetaminophen (TYLENOL) 325 MG tablet Take 2 tablets (650 mg total) by mouth every 4 (four) hours as needed (for pain scale < 4). 12/11/15   Juliene Pina, CNM  ?coconut oil OIL Apply 1 application topically as needed. 12/11/15   Juliene Pina, CNM  ?cyclobenzaprine (FLEXERIL) 10 MG tablet Take 1 tablet (10 mg total) by mouth 2 (two) times daily as needed for muscle spasms. 03/27/19   Gareth Morgan, MD  ?ibuprofen (ADVIL,MOTRIN) 600 MG tablet Take 1 tablet (600 mg total) by mouth every 6 (six) hours. 12/11/15   Juliene Pina, CNM  ?magnesium oxide (MAG-OX) 400 (241.3 Mg) MG tablet Take 1 tablet (400 mg total) by mouth daily. 12/11/15   Juliene Pina, CNM  ?pantoprazole (PROTONIX) 20 MG tablet Take 2 tablets (40 mg total) by mouth daily for 14 days. 03/27/19 04/10/19  Gareth Morgan, MD  ?polyethylene glycol (MIRALAX / GLYCOLAX) packet Take 17 g by mouth daily as needed. 12/11/15   Juliene Pina, CNM  ?Prenatal Vit-Fe Fumarate-FA (PRENATAL MULTIVITAMIN) TABS tablet Take 1 tablet by mouth daily at 12 noon.    [provider]  ?   ? ?Allergies    ?Patient has no known allergies.   ? ?Review of  Systems   ?Review of Systems ? ?Physical Exam ?Updated Vital Signs ?BP (!) 136/95 (BP Location: Left Arm)   Pulse 94   Temp 98.3 ?F (36.8 ?C) (Oral)   Resp 18   LMP 07/24/2021   SpO2 100%  ?Physical Exam ?Vitals and nursing note reviewed.  ?Constitutional:   ?   General: She is not in acute distress. ?   Appearance: She is well-developed. She is not diaphoretic.  ?HENT:  ?   Head: Normocephalic and atraumatic.  ?   Right Ear: External ear normal.  ?   Left Ear: External ear normal.  ?   Nose: Nose normal.  ?   Mouth/Throat:  ?   Mouth: Mucous membranes are moist.  ?Eyes:  ?   General: No scleral icterus. ?   Conjunctiva/sclera: Conjunctivae normal.  ?Cardiovascular:  ?   Rate and Rhythm: Normal rate and regular rhythm.  ?   Heart sounds: Normal heart sounds. No murmur heard. ?  No friction rub. No gallop.  ?Pulmonary:  ?   Effort: Pulmonary effort is normal. No respiratory distress.  ?   Breath sounds: Normal breath sounds.  ?Abdominal:  ?   General: Bowel sounds are normal. There is no distension.  ?   Palpations: Abdomen is soft. There is no mass.  ?   Tenderness: There is no abdominal tenderness. There is  no guarding.  ?Genitourinary: ?   Comments: Pelvic exam: normal external genitalia, vulva, vagina, cervix, uterus and adnexa, exam chaperoned by Eyvonne Left, PA-C ? ?Musculoskeletal:  ?   Cervical back: Normal range of motion.  ?Skin: ?   General: Skin is warm and dry.  ?Neurological:  ?   Mental Status: She is alert and oriented to person, place, and time.  ?Psychiatric:     ?   Behavior: Behavior normal.  ? ? ?ED Results / Procedures / Treatments   ?Labs ?(all labs ordered are listed, but only abnormal results are displayed) ?Labs Reviewed  ?I-STAT BETA HCG BLOOD, ED ([facility], [facility], [facility] ONLY)  ? ? ?EKG ?None ? ?Radiology ?No results found. ? ?Procedures ?Procedures  ? ? ?Medications Ordered in ED ?Medications - No data to display ? ?ED Course/ Medical Decision Making/ A&P ?Clinical Course as of 09/01/21 1601   ?Wed Sep 01, 2021  ?1557 hCG, quantitative, pregnancy(!) [AH]  ?1557 Wet prep, genital(!) [AH]  ?1557 CBC(!) [AH]  ?[id number] Basic metabolic panel(!) [AH]  ?  ?Clinical Course User Index ?[AH] Margarita Mail, PA-C  ? ?                        ?Medical Decision Making ?Patient here with concern for pelvic cramping in the setting of early pregnancy.  She is approximately 5 weeks.  No apparent concern for ectopic pregnancy.  She will need follow-up for serial hCG to rule in viability.  I discussed these findings with the patient and will give outpatient follow-up with GYN.  Patient's urine is currently still pending I have asked PA Badalamente to assume care of the patient follow-up on UA.   ? ?Amount and/or Complexity of Data Reviewed ?Labs: ordered. Decision-making details documented in ED Course. ?Radiology: ordered and independent interpretation performed. ?   Details: I personally visualized patient's pelvic ultrasound and agree with radiologic interpretation ? ? ? ? ? ?  ? ? ? ? ?Final Clinical Impression(s) / ED Diagnoses ?Final diagnoses:  ?Pelvic pain affecting pregnancy in first trimester, antepartum  ? ? ?Rx / DC Orders ?ED Discharge Orders   ? ? None  ? ?  ? ? ?  ?Margarita Mail, PA-C ?09/01/21 1604 ? ?  ?Regan Lemming, MD ?09/01/21 1648 ? ?

## 2021-09-02 LAB — GC/CHLAMYDIA PROBE AMP (~~LOC~~) NOT AT ARMC
Chlamydia: NEGATIVE
Comment: NEGATIVE
Comment: NORMAL
Neisseria Gonorrhea: NEGATIVE

## 2023-04-08 ENCOUNTER — Emergency Department (HOSPITAL_COMMUNITY)
Admission: EM | Admit: 2023-04-08 | Discharge: 2023-04-09 | Disposition: A | Discharge status: Home / Self Care | Payer: BLUE CROSS/BLUE SHIELD | Attending: Emergency Medicine | Admitting: Emergency Medicine

## 2023-04-08 ENCOUNTER — Encounter (HOSPITAL_COMMUNITY): Payer: Self-pay

## 2023-04-08 ENCOUNTER — Emergency Department (HOSPITAL_COMMUNITY): Payer: BLUE CROSS/BLUE SHIELD

## 2023-04-08 ENCOUNTER — Other Ambulatory Visit: Payer: Self-pay

## 2023-04-08 DIAGNOSIS — W108XXA Fall (on) (from) other stairs and steps, initial encounter: Secondary | ICD-10-CM | POA: Diagnosis not present

## 2023-04-08 DIAGNOSIS — S91201A Unspecified open wound of right great toe with damage to nail, initial encounter: Secondary | ICD-10-CM | POA: Diagnosis not present

## 2023-04-08 DIAGNOSIS — M7989 Other specified soft tissue disorders: Secondary | ICD-10-CM | POA: Diagnosis not present

## 2023-04-08 DIAGNOSIS — Y9301 Activity, walking, marching and hiking: Secondary | ICD-10-CM | POA: Insufficient documentation

## 2023-04-08 DIAGNOSIS — S99921A Unspecified injury of right foot, initial encounter: Secondary | ICD-10-CM | POA: Diagnosis present

## 2023-04-08 DIAGNOSIS — S91209A Unspecified open wound of unspecified toe(s) with damage to nail, initial encounter: Secondary | ICD-10-CM

## 2023-04-08 NOTE — ED Provider Triage Note (Signed)
Emergency Medicine Provider Triage Evaluation Note  Rhonda Cisneros , a 27 y.o. female  was evaluated in triage.  Pt complains of toe injury.  Review of Systems  Positive: As above Negative: As above  Physical Exam  BP 109/80 (BP Location: Left Arm)   Pulse 92   Temp 99 F (37.2 C) (Oral)   Resp 16   SpO2 100%  Gen:   Awake, no distress   Resp:  Normal effort  MSK:   Moves extremities without difficulty  Other:    Medical Decision Making  Medically screening exam initiated at 10:46 PM.  Appropriate orders placed.  Rhonda Cisneros was informed that the remainder of the evaluation will be completed by another provider, this initial triage assessment does not replace that evaluation, and the importance of remaining in the ED until their evaluation is complete.    Marita Kansas, PA-C 04/08/23 2246

## 2023-04-08 NOTE — ED Triage Notes (Signed)
Patient arrived with injury to the right big toe. Toenail appears to be lifted and bleeding. Patient states she fell going up the stairs. Did not hit her head. No LOC. No blood thinners.

## 2023-04-09 MED ORDER — OXYCODONE-ACETAMINOPHEN 5-325 MG PO TABS: 1.0000 | ORAL_TABLET | Freq: Four times a day (QID) | ORAL | 0 refills | Status: AC | PRN

## 2023-04-09 MED ORDER — LIDOCAINE HCL (PF) 1 % IJ SOLN
30.0000 mL | Freq: Once | INTRAMUSCULAR | Status: AC
  Administered 2023-04-09: 30 mL
  Filled 2023-04-09: qty 30

## 2023-04-09 MED ORDER — OXYCODONE-ACETAMINOPHEN 5-325 MG PO TABS
1.0000 | ORAL_TABLET | Freq: Once | ORAL | Status: AC
  Administered 2023-04-09: 1 via ORAL
  Filled 2023-04-09: qty 1

## 2023-04-09 MED ORDER — IBUPROFEN 800 MG PO TABS
800.0000 mg | ORAL_TABLET | Freq: Once | ORAL | Status: AC
Start: 2023-04-09 — End: 2023-04-09
  Administered 2023-04-09: 800 mg via ORAL
  Filled 2023-04-09: qty 1

## 2023-04-09 NOTE — ED Provider Notes (Signed)
[place] EMERGENCY DEPARTMENT AT [facility] Provider Note   CSN: [id number] Arrival date & time: 04/08/23  2146     History  Chief Complaint  Patient presents with   Toe Injury    Rhonda Cisneros is a 27 y.o. female who states that she was walking up the stairs today, tripped, and hold her right great toenail up.  States that she wanted to go out tonight and therefore attempted to pull the toenail completely off for removal prior to leaving the home, however found that she simply pulled the nail base out incompletely prompting ED visit.  HPI     Home Medications Prior to Admission medications   Medication Sig Start Date End Date Taking? Authorizing Provider  oxyCODONE-acetaminophen (PERCOCET/ROXICET) 5-325 MG tablet Take 1 tablet by mouth every 6 (six) hours as needed for severe pain (pain score 7-10). 04/09/23  Yes Danyon Mcginness, Eugene Gavia, PA-C  acetaminophen (TYLENOL) 325 MG tablet Take 2 tablets (650 mg total) by mouth every 4 (four) hours as needed (for pain scale < 4). 12/11/15   Neta Mends, CNM  coconut oil OIL Apply 1 application topically as needed. 12/11/15   Neta Mends, CNM  cyclobenzaprine (FLEXERIL) 10 MG tablet Take 1 tablet (10 mg total) by mouth 2 (two) times daily as needed for muscle spasms. 03/27/19   Alvira Monday, MD  ibuprofen (ADVIL,MOTRIN) 600 MG tablet Take 1 tablet (600 mg total) by mouth every 6 (six) hours. 12/11/15   Neta Mends, CNM  magnesium oxide (MAG-OX) 400 (241.3 Mg) MG tablet Take 1 tablet (400 mg total) by mouth daily. 12/11/15   Neta Mends, CNM  pantoprazole (PROTONIX) 20 MG tablet Take 2 tablets (40 mg total) by mouth daily for 14 days. 03/27/19 04/10/19  Alvira Monday, MD  polyethylene glycol (MIRALAX / GLYCOLAX) packet Take 17 g by mouth daily as needed. 12/11/15   Neta Mends, CNM  Prenatal Vit-Fe Fumarate-FA (PRENATAL MULTIVITAMIN) TABS tablet Take 1 tablet by mouth daily at 12 noon.    [provider]      Allergies    Patient has no known allergies.    Review of Systems   Review of Systems  Skin:        Right great toenail avulsion    Physical Exam Updated Vital Signs BP 124/83   Pulse 85   Temp 97.8 F (36.6 C) (Oral)   Resp 16   LMP 03/19/2023   SpO2 100%  Physical Exam Vitals and nursing note reviewed.  Constitutional:      Appearance: She is not ill-appearing or toxic-appearing.  HENT:     Head: Normocephalic and atraumatic.  Eyes:     General: No scleral icterus.       Right eye: No discharge.        Left eye: No discharge.     Conjunctiva/sclera: Conjunctivae normal.  Pulmonary:     Effort: Pulmonary effort is normal.  Musculoskeletal:       Feet:  Skin:    General: Skin is warm and dry.  Neurological:     General: No focal deficit present.     Mental Status: She is alert.  Psychiatric:        Mood and Affect: Mood normal.     ED Results / Procedures / Treatments   Labs (all labs ordered are listed, but only abnormal results are displayed) Labs Reviewed - No data to display  EKG None  Radiology DG Foot  Complete Right  Result Date: 04/09/2023 CLINICAL DATA:  Status post right great toe trauma. EXAM: RIGHT FOOT COMPLETE - 3+ VIEW COMPARISON:  None Available. FINDINGS: There is no evidence of fracture or dislocation. There is no evidence of arthropathy or other focal bone abnormality. The toenail of the right great toe is separated from the remainder of the right great toe. Soft tissues are otherwise unremarkable. IMPRESSION: 1. No acute osseous abnormality. 2. Separation of the toenail of the right great toe. Electronically Signed   By: Aram Candela M.D.   On: 04/09/2023 00:00    Procedures .Nail Removal  Date/Time: 04/09/2023 7:47 AM  Performed by: Paris Lore, PA-C Authorized by: Paris Lore, PA-C   Consent:    Consent obtained:  Verbal   Consent given by:  Patient   Risks discussed:  Bleeding and  pain   Alternatives discussed:  No treatment Universal protocol:    Imaging studies available: yes (No underlying fracture)     Patient identity confirmed:  Verbally with patient Pre-procedure details:    Skin preparation:  Povidone-iodine   Preparation: Patient was prepped and draped in the usual sterile fashion   Procedure details:    Location:  Foot   Foot location:  R big toe Anesthesia:    Anesthesia method:  Local infiltration   Local anesthetic:  Lidocaine 1% w/o epi (Digital block, incomplete anesthesia.) Nail Removal:    Nail bed repaired: no     Removed nail replaced and anchored: Insufficient anesthetic despite multiple administration of lidocaine to the toe.  Insufficient digital block.  Patient with solely medial avulsion of the matrix from the nailbed.  Unfortunately patient unable to tolerate attempts to replace the matrix.Marland Kitchen   Post-procedure details:    Dressing:  Post-op shoe, 4x4 sterile gauze and gauze roll   Procedure completion:  Procedure terminated at patient's request Comments:     Patient placed in postoperative shoe following irrigation and dressing by ED RN.     Medications Ordered in ED Medications  lidocaine (PF) (XYLOCAINE) 1 % injection 30 mL (30 mLs Other Given by Other 04/09/23 0609)  oxyCODONE-acetaminophen (PERCOCET/ROXICET) 5-325 MG per tablet 1 tablet (1 tablet Oral Given 04/09/23 0609)  ibuprofen (ADVIL) tablet 800 mg (800 mg Oral Given 04/09/23 1610)    ED Course/ Medical Decision Making/ A&P Clinical Course as of 04/09/23 0750  Sun Apr 09, 2023  0503 DG Foot Complete Right [RS]    Clinical Course User Index [RS] Paris Lore, PA-C                                 Medical Decision Making Amount and/or Complexity of Data Reviewed Radiology:  Decision-making details documented in ED Course.  Risk Prescription drug management.   While patient fell with podiatry for definitive repair of nail avulsion.  Clinical concern for  emergent early etiology of this patient symptoms that would warrant further ED workup or inpatient management is exceedingly low.  Wynona  voiced understanding of her medical evaluation and treatment plan. Each of their questions answered to their expressed satisfaction.  Return precautions were given.  Patient is well-appearing, stable, and was discharged in good condition.  This chart was dictated using voice recognition software, Dragon. Despite the best efforts of this provider to proofread and correct errors, errors may still occur which can change documentation meaning.         Final Clinical  Impression(s) / ED Diagnoses Final diagnoses:  Avulsion of toenail, initial encounter    Rx / DC Orders ED Discharge Orders          Ordered    oxyCODONE-acetaminophen (PERCOCET/ROXICET) 5-325 MG tablet  Every 6 hours PRN        04/09/23 0716              Kemontae Dunklee, Eugene Gavia, PA-C 04/09/23 0750    Gloris Manchester, MD 04/09/23 [phone]

## 2023-04-09 NOTE — Discharge Instructions (Addendum)
Please follow-up with the podiatrist listed below.  Wear the postop shoe at all times when you are up on your feet.  Keep your toe clean and covered when up and about.  Return to the ER with any severe symptoms

## 2023-04-10 ENCOUNTER — Encounter (HOSPITAL_COMMUNITY): Payer: Self-pay

## 2023-04-10 ENCOUNTER — Other Ambulatory Visit: Payer: Self-pay

## 2023-04-10 ENCOUNTER — Emergency Department (HOSPITAL_COMMUNITY)
Admission: EM | Admit: 2023-04-10 | Discharge: 2023-04-10 | Disposition: A | Discharge status: Home / Self Care | Payer: BLUE CROSS/BLUE SHIELD | Attending: Emergency Medicine | Admitting: Emergency Medicine

## 2023-04-10 DIAGNOSIS — X58XXXS Exposure to other specified factors, sequela: Secondary | ICD-10-CM | POA: Insufficient documentation

## 2023-04-10 DIAGNOSIS — S91201S Unspecified open wound of right great toe with damage to nail, sequela: Secondary | ICD-10-CM | POA: Diagnosis not present

## 2023-04-10 DIAGNOSIS — Z79899 Other long term (current) drug therapy: Secondary | ICD-10-CM | POA: Insufficient documentation

## 2023-04-10 DIAGNOSIS — S99921S Unspecified injury of right foot, sequela: Secondary | ICD-10-CM | POA: Diagnosis present

## 2023-04-10 DIAGNOSIS — S91209S Unspecified open wound of unspecified toe(s) with damage to nail, sequela: Secondary | ICD-10-CM

## 2023-04-10 MED ORDER — IBUPROFEN 600 MG PO TABS: 600.0000 mg | ORAL_TABLET | Freq: Four times a day (QID) | ORAL | 0 refills | Status: AC | PRN

## 2023-04-10 MED ORDER — HYDROCODONE-ACETAMINOPHEN 5-325 MG PO TABS: 1.0000 | ORAL_TABLET | Freq: Four times a day (QID) | ORAL | 0 refills | Status: AC | PRN

## 2023-04-10 NOTE — ED Provider Notes (Signed)
[place] EMERGENCY DEPARTMENT AT [facility] Provider Note   CSN: [id number] Arrival date & time: 04/10/23  1138     History  Chief Complaint  Patient presents with   Toe Pain    Rhonda Cisneros is a 27 y.o. female.  Patient returns to ED with right great toe pain after injury causing nail avulsion. Seen yesterday and digital block was attempted and unsuccessful. Was referred to podiatry and she scheduled the first available appointment for the 27th. She reports tylenol and ibuprofen are not controlling pain. No further injury.   The history is provided by the patient. No language interpreter was used.  Toe Pain       Home Medications Prior to Admission medications   Medication Sig Start Date End Date Taking? Authorizing Provider  HYDROcodone-acetaminophen (NORCO) 5-325 MG tablet Take 1 tablet by mouth every 6 (six) hours as needed for moderate pain (pain score 4-6). 04/10/23  Yes Manila Rommel, Melvenia Beam, PA-C  ibuprofen (ADVIL) 600 MG tablet Take 1 tablet (600 mg total) by mouth every 6 (six) hours as needed. 04/10/23  Yes Elpidio Anis, PA-C  acetaminophen (TYLENOL) 325 MG tablet Take 2 tablets (650 mg total) by mouth every 4 (four) hours as needed (for pain scale < 4). 12/11/15   Neta Mends, CNM  coconut oil OIL Apply 1 application topically as needed. 12/11/15   Neta Mends, CNM  cyclobenzaprine (FLEXERIL) 10 MG tablet Take 1 tablet (10 mg total) by mouth 2 (two) times daily as needed for muscle spasms. 03/27/19   Alvira Monday, MD  magnesium oxide (MAG-OX) 400 (241.3 Mg) MG tablet Take 1 tablet (400 mg total) by mouth daily. 12/11/15   Neta Mends, CNM  oxyCODONE-acetaminophen (PERCOCET/ROXICET) 5-325 MG tablet Take 1 tablet by mouth every 6 (six) hours as needed for severe pain (pain score 7-10). 04/09/23   Sponseller, Lupe Carney R, PA-C  pantoprazole (PROTONIX) 20 MG tablet Take 2 tablets (40 mg total) by mouth daily for 14 days. 03/27/19 04/10/19   Alvira Monday, MD  polyethylene glycol (MIRALAX / GLYCOLAX) packet Take 17 g by mouth daily as needed. 12/11/15   Neta Mends, CNM  Prenatal Vit-Fe Fumarate-FA (PRENATAL MULTIVITAMIN) TABS tablet Take 1 tablet by mouth daily at 12 noon.    [provider]      Allergies    Patient has no known allergies.    Review of Systems   Review of Systems  Physical Exam Updated Vital Signs BP 124/76 (BP Location: Right Arm)   Pulse 83   Temp 98.3 F (36.8 C) (Oral)   Resp 16   Ht 5\' 6"  (1.676 m)   Wt 121.6 kg   LMP 03/19/2023   SpO2 100%   BMI 43.27 kg/m  Physical Exam Vitals and nursing note reviewed.  Constitutional:      Appearance: Normal appearance.  Cardiovascular:     Rate and Rhythm: Normal rate.  Pulmonary:     Effort: Pulmonary effort is normal.  Musculoskeletal:     Comments: Right great toe mildly erythematous to MTP joint. Minimal swelling. Nail dislodged from the cuticle along the lateral edge. No bleeding.   Skin:    General: Skin is warm and dry.     Findings: Erythema present.  Neurological:     Mental Status: She is alert.     Sensory: No sensory deficit.     ED Results / Procedures / Treatments   Labs (all labs ordered are listed, but only abnormal  results are displayed) Labs Reviewed - No data to display  EKG None  Radiology DG Foot Complete Right  Result Date: 04/09/2023 CLINICAL DATA:  Status post right great toe trauma. EXAM: RIGHT FOOT COMPLETE - 3+ VIEW COMPARISON:  None Available. FINDINGS: There is no evidence of fracture or dislocation. There is no evidence of arthropathy or other focal bone abnormality. The toenail of the right great toe is separated from the remainder of the right great toe. Soft tissues are otherwise unremarkable. IMPRESSION: 1. No acute osseous abnormality. 2. Separation of the toenail of the right great toe. Electronically Signed   By: Aram Candela M.D.   On: 04/09/2023 00:00    Procedures Procedures     Medications Ordered in ED Medications - No data to display  ED Course/ Medical Decision Making/ A&P Clinical Course as of 04/10/23 1310  Mon Apr 10, 2023  1305 Patient with toe nail avulsion from injury. Referred to podiatry, appt. On the 27th. Reports significant pain in the toe. There is some erythema. Will cover with short course keflex, provide additional pain relief. Database reviewed.  #12 Norco provided, urged to continue ibuprofen.  [SU]    Clinical Course User Index [SU] Elpidio Anis, PA-C                                 Medical Decision Making          Final Clinical Impression(s) / ED Diagnoses Final diagnoses:  Nail avulsion of toe, sequela    Rx / DC Orders ED Discharge Orders          Ordered    HYDROcodone-acetaminophen (NORCO) 5-325 MG tablet  Every 6 hours PRN        04/10/23 1309    ibuprofen (ADVIL) 600 MG tablet  Every 6 hours PRN        04/10/23 1309              Elpidio Anis, PA-C 04/10/23 1310    Ernie Avena, MD 04/10/23 1840

## 2023-04-10 NOTE — Discharge Instructions (Signed)
Keep your scheduled appointment with podiatry. You may want to call the office and ask to be placed on the cancellation list in the event an appointment becomes available.

## 2023-04-10 NOTE — ED Triage Notes (Signed)
Patient is here for evaluation of right big toe pain. Reports being seen here for the same a few days ago and was told to follow up with podiatry. States unable to get an appointment until 11/27 and is having issues with pain.

## 2023-06-30 ENCOUNTER — Encounter (HOSPITAL_COMMUNITY): Payer: Self-pay

## 2023-06-30 ENCOUNTER — Emergency Department (HOSPITAL_COMMUNITY)
Admission: EM | Admit: 2023-06-30 | Discharge: 2023-06-30 | Disposition: A | Discharge status: Home / Self Care | Payer: 59 | Attending: Emergency Medicine | Admitting: Emergency Medicine

## 2023-06-30 ENCOUNTER — Other Ambulatory Visit: Payer: Self-pay

## 2023-06-30 DIAGNOSIS — J1282 Pneumonia due to coronavirus disease 2019: Secondary | ICD-10-CM | POA: Diagnosis not present

## 2023-06-30 DIAGNOSIS — U071 COVID-19: Secondary | ICD-10-CM | POA: Diagnosis not present

## 2023-06-30 DIAGNOSIS — J029 Acute pharyngitis, unspecified: Secondary | ICD-10-CM | POA: Diagnosis present

## 2023-06-30 MED ORDER — ALBUTEROL SULFATE HFA 108 (90 BASE) MCG/ACT IN AERS
2.0000 | INHALATION_SPRAY | Freq: Once | RESPIRATORY_TRACT | Status: AC
  Administered 2023-06-30: 2 via RESPIRATORY_TRACT
  Filled 2023-06-30: qty 6.7

## 2023-06-30 MED ORDER — ACETAMINOPHEN 325 MG PO TABS
650.0000 mg | ORAL_TABLET | Freq: Once | ORAL | Status: AC | PRN
  Administered 2023-06-30: 650 mg via ORAL
  Filled 2023-06-30: qty 2

## 2023-06-30 MED ORDER — ACETAMINOPHEN ER 650 MG PO TBCR: 650.0000 mg | EXTENDED_RELEASE_TABLET | Freq: Three times a day (TID) | ORAL | 0 refills | Status: AC | PRN

## 2023-06-30 MED ORDER — ONDANSETRON 8 MG PO TBDP: 8.0000 mg | ORAL_TABLET | Freq: Three times a day (TID) | ORAL | 0 refills | Status: AC | PRN

## 2023-06-30 MED ORDER — ACETAMINOPHEN 325 MG PO TABS
650.0000 mg | ORAL_TABLET | Freq: Once | ORAL | Status: AC
  Administered 2023-06-30: 650 mg via ORAL
  Filled 2023-06-30: qty 2

## 2023-06-30 MED ORDER — GUAIFENESIN 100 MG/5ML PO LIQD
5.0000 mL | Freq: Once | ORAL | Status: AC
  Administered 2023-06-30: 5 mL via ORAL
  Filled 2023-06-30: qty 10

## 2023-06-30 MED ORDER — GUAIFENESIN ER 600 MG PO TB12: 600.0000 mg | ORAL_TABLET | Freq: Two times a day (BID) | ORAL | 0 refills | Status: AC

## 2023-06-30 NOTE — ED Provider Notes (Signed)
[place] EMERGENCY DEPARTMENT AT [facility] Provider Note   CSN: [id number] Arrival date & time: 06/30/23  1638     History  Chief Complaint  Patient presents with   Cough    Rhonda Cisneros is a 28 y.o. female.  HPI    SUBJECTIVE:  Rhonda Cisneros is a 28 y.o. female who complains of congestion, sore throat, productive cough, and cough described as productive of yellow sputum for 2 days. She denies a history of vomiting and denies a history of asthma.  Patient had a positive COVID test at home.  She states that the last time she had COVID-19 was 2021, when she got sick.   OBJECTIVE: She appears well, vital signs are as noted. Ears normal.  Throat and pharynx normal.  Neck supple. No adenopathy in the neck. Nose is congested. Sinuses non tender. The chest is clear, without wheezes or rales.     Home Medications Prior to Admission medications   Medication Sig Start Date End Date Taking? Authorizing Provider  acetaminophen (TYLENOL 8 HOUR) 650 MG CR tablet Take 1 tablet (650 mg total) by mouth every 8 (eight) hours as needed for pain or fever. 06/30/23  Yes Derwood Kaplan, MD  guaiFENesin (MUCINEX) 600 MG 12 hr tablet Take 1 tablet (600 mg total) by mouth 2 (two) times daily. 06/30/23  Yes Lashunta Frieden, MD  ondansetron (ZOFRAN-ODT) 8 MG disintegrating tablet Take 1 tablet (8 mg total) by mouth every 8 (eight) hours as needed for nausea. 06/30/23  Yes Derwood Kaplan, MD  coconut oil OIL Apply 1 application topically as needed. 12/11/15   Neta Mends, CNM  cyclobenzaprine (FLEXERIL) 10 MG tablet Take 1 tablet (10 mg total) by mouth 2 (two) times daily as needed for muscle spasms. 03/27/19   Alvira Monday, MD  HYDROcodone-acetaminophen (NORCO) 5-325 MG tablet Take 1 tablet by mouth every 6 (six) hours as needed for moderate pain (pain score 4-6). 04/10/23   Elpidio Anis, PA-C  ibuprofen (ADVIL) 600 MG tablet Take 1 tablet (600 mg total) by mouth every 6 (six)  hours as needed. 04/10/23   Elpidio Anis, PA-C  magnesium oxide (MAG-OX) 400 (241.3 Mg) MG tablet Take 1 tablet (400 mg total) by mouth daily. 12/11/15   Neta Mends, CNM  oxyCODONE-acetaminophen (PERCOCET/ROXICET) 5-325 MG tablet Take 1 tablet by mouth every 6 (six) hours as needed for severe pain (pain score 7-10). 04/09/23   Sponseller, Lupe Carney R, PA-C  pantoprazole (PROTONIX) 20 MG tablet Take 2 tablets (40 mg total) by mouth daily for 14 days. 03/27/19 04/10/19  Alvira Monday, MD  polyethylene glycol (MIRALAX / GLYCOLAX) packet Take 17 g by mouth daily as needed. 12/11/15   Neta Mends, CNM  Prenatal Vit-Fe Fumarate-FA (PRENATAL MULTIVITAMIN) TABS tablet Take 1 tablet by mouth daily at 12 noon.    [provider]      Allergies    Patient has no known allergies.    Review of Systems   Review of Systems  All other systems reviewed and are negative.   Physical Exam Updated Vital Signs BP 133/86 (BP Location: Left Arm)   Pulse (!) 109   Temp (!) 100.8 F (38.2 C) (Oral)   Resp 18   Ht 5\' 6"  (1.676 m)   Wt 121 kg   SpO2 97%   BMI 43.06 kg/m  Physical Exam Vitals and nursing note reviewed.  Constitutional:      Appearance: She is well-developed.  HENT:  Head: Atraumatic.  Cardiovascular:     Rate and Rhythm: Normal rate.  Pulmonary:     Effort: Pulmonary effort is normal.  Musculoskeletal:     Cervical back: Normal range of motion and neck supple.  Skin:    General: Skin is warm and dry.  Neurological:     Mental Status: She is alert and oriented to person, place, and time.     ED Results / Procedures / Treatments   Labs (all labs ordered are listed, but only abnormal results are displayed) Labs Reviewed - No data to display  EKG None  Radiology No results found.  Procedures Procedures    Medications Ordered in ED Medications  guaiFENesin (ROBITUSSIN) 100 MG/5ML liquid 5 mL (has no administration in time range)  albuterol  (VENTOLIN HFA) 108 (90 Base) MCG/ACT inhaler 2 puff (has no administration in time range)  acetaminophen (TYLENOL) tablet 650 mg (has no administration in time range)  acetaminophen (TYLENOL) tablet 650 mg (650 mg Oral Given 06/30/23 1700)    ED Course/ Medical Decision Making/ A&P                                 Medical Decision Making Risk OTC drugs. Prescription drug management.   28 year old patient comes in with chief complaint of URI-like symptoms. Indicates that she has COVID. Patient has no hypoxia, does not appear toxic.  No indication for imaging, lab workup. Will treat with supportive medication.  We did discuss Paxlovid with the patient given her BMI, but patient did not wanted, she was comfortable with supportive care only.  Therefore we will not need any labs either.  ASSESSMENT:  viral upper respiratory illness, COVID-19  PLAN: Symptomatic therapy suggested: push fluids, rest, and use acetaminophen, Robitussin CF prn. Lack of antibiotic effectiveness discussed with her. Call or return to clinic prn if these symptoms worsen.  Final Clinical Impression(s) / ED Diagnoses Final diagnoses:  Pneumonia due to COVID-19 virus    Rx / DC Orders ED Discharge Orders          Ordered    acetaminophen (TYLENOL 8 HOUR) 650 MG CR tablet  Every 8 hours PRN        06/30/23 2150    guaiFENesin (MUCINEX) 600 MG 12 hr tablet  2 times daily        06/30/23 2150    ondansetron (ZOFRAN-ODT) 8 MG disintegrating tablet  Every 8 hours PRN        06/30/23 2150              Derwood Kaplan, MD 06/30/23 2204

## 2023-06-30 NOTE — ED Triage Notes (Signed)
C/o cough, body aches, and congestion x1 day.  Pt reports + covid with home test yesterday.  Pt states "last time I had covid I was admitted".

## 2023-06-30 NOTE — Discharge Instructions (Addendum)
You were seen in the ER for cough, fevers and you have tested positive for COVID-19 at home.  Your vital signs are stable here.  As discussed, COVID-19 has no cure. Please take the medications that are prescribed for symptom management.  Use albuterol inhaler every 4 hours as needed for chest tightness or shortness of breath  Return to the emergency room if you start having worsening shortness of breath, severe dizziness, near fainting.  Make sure you are drinking plenty of water.

## 2023-06-30 NOTE — ED Provider Triage Note (Signed)
Emergency Medicine Provider Triage Evaluation Note  Rhonda Cisneros , a 28 y.o. female  was evaluated in triage.  Pt complains of feeling poorly. COVID positive at home. Some cough. No meds at home.  Review of Systems  Positive: Cough, fever, nausea Negative:   Physical Exam  BP (!) 147/95 (BP Location: Right Arm)   Pulse (!) 113   Temp (!) 102.9 F (39.4 C) (Oral)   Resp 18   Ht 5\' 6"  (1.676 m)   Wt 121 kg   SpO2 100%   BMI 43.06 kg/m  Gen:   Awake, no distress   Resp:  Normal effort  MSK:   Moves extremities without difficulty  Other:    Medical Decision Making  Medically screening exam initiated at 5:25 PM.  Appropriate orders placed.  Rhonda Cisneros was informed that the remainder of the evaluation will be completed by another provider, this initial triage assessment does not replace that evaluation, and the importance of remaining in the ED until their evaluation is complete.  COVID   Brigitt Mcclish A, PA-C 06/30/23 1726
